# Patient Record
Sex: Male | Born: 1990 | Race: Black or African American | Hispanic: No | Marital: Married | Smoking: Former smoker
Health system: Southern US, Community
[De-identification: ages and names within clinical notes are randomized; demographics above are authoritative.]

## PROBLEM LIST (undated history)

## (undated) DIAGNOSIS — E119 Type 2 diabetes mellitus without complications: Secondary | ICD-10-CM

## (undated) DIAGNOSIS — I10 Essential (primary) hypertension: Secondary | ICD-10-CM

## (undated) DIAGNOSIS — Z8659 Personal history of other mental and behavioral disorders: Secondary | ICD-10-CM

## (undated) DIAGNOSIS — I340 Nonrheumatic mitral (valve) insufficiency: Secondary | ICD-10-CM

## (undated) DIAGNOSIS — F419 Anxiety disorder, unspecified: Secondary | ICD-10-CM

## (undated) DIAGNOSIS — J45909 Unspecified asthma, uncomplicated: Secondary | ICD-10-CM

## (undated) DIAGNOSIS — K5900 Constipation, unspecified: Secondary | ICD-10-CM

## (undated) DIAGNOSIS — F325 Major depressive disorder, single episode, in full remission: Secondary | ICD-10-CM

## (undated) HISTORY — DX: Unspecified asthma, uncomplicated: J45.909

## (undated) HISTORY — DX: Type 2 diabetes mellitus without complications: E11.9

## (undated) HISTORY — DX: Anxiety disorder, unspecified: F41.9

## (undated) HISTORY — DX: Major depressive disorder, single episode, in full remission: F32.5

## (undated) HISTORY — PX: NO PAST SURGERIES: SHX2092

## (undated) HISTORY — DX: Morbid (severe) obesity due to excess calories: E66.01

## (undated) HISTORY — DX: Constipation, unspecified: K59.00

## (undated) HISTORY — DX: Essential (primary) hypertension: I10

## (undated) HISTORY — DX: Personal history of other mental and behavioral disorders: Z86.59

## (undated) HISTORY — DX: Nonrheumatic mitral (valve) insufficiency: I34.0

---
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
## 2018-11-25 ENCOUNTER — Emergency Department (HOSPITAL_COMMUNITY)
Admission: EM | Admit: 2018-11-25 | Discharge: 2018-11-25 | Disposition: A | Discharge status: Home / Self Care | Payer: Self-pay | Attending: Emergency Medicine | Admitting: Emergency Medicine

## 2018-11-25 ENCOUNTER — Encounter (HOSPITAL_COMMUNITY): Payer: Self-pay

## 2018-11-25 ENCOUNTER — Other Ambulatory Visit: Payer: Self-pay

## 2018-11-25 DIAGNOSIS — K529 Noninfective gastroenteritis and colitis, unspecified: Secondary | ICD-10-CM | POA: Insufficient documentation

## 2018-11-25 LAB — URINALYSIS, ROUTINE W REFLEX MICROSCOPIC
Bacteria, UA: NONE SEEN
Bilirubin Urine: NEGATIVE
Glucose, UA: NEGATIVE mg/dL
KETONES UR: NEGATIVE mg/dL
LEUKOCYTES UA: NEGATIVE
Nitrite: NEGATIVE
PROTEIN: NEGATIVE mg/dL
Specific Gravity, Urine: 1.018 (ref 1.005–1.030)
pH: 5 (ref 5.0–8.0)

## 2018-11-25 LAB — COMPREHENSIVE METABOLIC PANEL
ALBUMIN: 4.4 g/dL (ref 3.5–5.0)
ALT: 28 U/L (ref 0–44)
AST: 26 U/L (ref 15–41)
Alkaline Phosphatase: 77 U/L (ref 38–126)
Anion gap: 8 (ref 5–15)
BUN: 10 mg/dL (ref 6–20)
CHLORIDE: 105 mmol/L (ref 98–111)
CO2: 23 mmol/L (ref 22–32)
Calcium: 9.5 mg/dL (ref 8.9–10.3)
Creatinine, Ser: 0.9 mg/dL (ref 0.61–1.24)
GFR calc Af Amer: >60 mL/min (ref >60)
GFR calc non Af Amer: >60 mL/min (ref >60)
Glucose, Bld: 151 mg/dL — ABNORMAL HIGH (ref 70–99)
POTASSIUM: 4.4 mmol/L (ref 3.5–5.1)
Sodium: 136 mmol/L (ref 135–145)
Total Bilirubin: 0.4 mg/dL (ref 0.3–1.2)
Total Protein: 7.8 g/dL (ref 6.5–8.1)

## 2018-11-25 LAB — CBC
HEMATOCRIT: 49.8 % (ref 39.0–52.0)
Hemoglobin: 15.2 g/dL (ref 13.0–17.0)
MCH: 27.3 pg (ref 26.0–34.0)
MCHC: 30.5 g/dL (ref 30.0–36.0)
MCV: 89.6 fL (ref 80.0–100.0)
Platelets: 330 10*3/uL (ref 150–400)
RBC: 5.56 MIL/uL (ref 4.22–5.81)
RDW: 13.8 % (ref 11.5–15.5)
WBC: 10.8 10*3/uL — ABNORMAL HIGH (ref 4.0–10.5)
nRBC: 0 % (ref 0.0–0.2)

## 2018-11-25 LAB — LIPASE, BLOOD: Lipase: 32 U/L (ref 11–51)

## 2018-11-25 MED ORDER — DICYCLOMINE HCL 10 MG/ML IM SOLN
20.0000 mg | Freq: Once | INTRAMUSCULAR | Status: AC
  Administered 2018-11-25: 20 mg via INTRAMUSCULAR
  Filled 2018-11-25: qty 2

## 2018-11-25 MED ORDER — FAMOTIDINE IN NACL 20-0.9 MG/50ML-% IV SOLN
20.0000 mg | Freq: Once | INTRAVENOUS | Status: AC
  Administered 2018-11-25: 20 mg via INTRAVENOUS
  Filled 2018-11-25: qty 50

## 2018-11-25 MED ORDER — ACETAMINOPHEN 325 MG PO TABS
650.0000 mg | ORAL_TABLET | Freq: Once | ORAL | Status: AC
  Administered 2018-11-25: 650 mg via ORAL
  Filled 2018-11-25: qty 2

## 2018-11-25 MED ORDER — ALUM & MAG HYDROXIDE-SIMETH 200-200-20 MG/5ML PO SUSP
30.0000 mL | Freq: Once | ORAL | Status: AC
  Administered 2018-11-25: 30 mL via ORAL
  Filled 2018-11-25: qty 30

## 2018-11-25 MED ORDER — ONDANSETRON HCL 4 MG PO TABS: 4.0000 mg | ORAL_TABLET | Freq: Three times a day (TID) | ORAL | 0 refills | Status: DC | PRN

## 2018-11-25 MED ORDER — ONDANSETRON HCL 4 MG/2ML IJ SOLN
4.0000 mg | Freq: Once | INTRAMUSCULAR | Status: AC
  Administered 2018-11-25: 4 mg via INTRAVENOUS
  Filled 2018-11-25: qty 2

## 2018-11-25 MED ORDER — SODIUM CHLORIDE 0.9 % IV BOLUS
500.0000 mL | Freq: Once | INTRAVENOUS | Status: AC
  Administered 2018-11-25: 500 mL via INTRAVENOUS

## 2018-11-25 NOTE — Discharge Instructions (Addendum)
Please make sure to stay well hydrated at home. Please take medications as prescribed at home.  Please follow up with your primary doctor in regards to your elevated blood pressure.  Please follow up with your primary doctor within the next 5-7 days. Please return to the ER sooner if you have any new or worsening symptoms, or if you have any of the following symptoms:  Abdominal pain that does not go away.  You have a fever.  You keep throwing up (vomiting).  The pain is felt only in portions of the abdomen. Pain in the right side could possibly be appendicitis. In an adult, pain in the left lower portion of the abdomen could be colitis or diverticulitis.  You pass bloody or black tarry stools.  There is bright red blood in the stool.  The constipation stays for more than 4 days.  There is belly (abdominal) or rectal pain.  You do not seem to be getting better.  You have any questions or concerns.

## 2018-11-25 NOTE — ED Provider Notes (Signed)
[facility]-EMERGENCY DEPT Provider Note   CSN: [id number] Arrival date & time: 11/25/18  4098     History   Chief Complaint Chief Complaint  Patient presents with  . Emesis    HPI Fred Reyes is a 27 y.o. male.  HPI   Pt is a 27 y/o male who presents to the ED today c/o nausea, vomiting (x1), diarrhea (3-4), and abd pain that began this morning. Abd pain is located to the epigastric area. Pain is intermittent. Sometimes pain improves after vomiting, however sometimes it is worse. Pain is worse when he burps. Pain is rated at 6/10.   No hematemesis, hematochezia, or melena. Denies fevers or urinary sxs.   Pt states he drank ETOH last night. States he drank 4 beers. Denies drug use. Denies heavy NSAID use.   History reviewed. No pertinent past medical history.  There are no active problems to display for this patient.   Home Medications    Prior to Admission medications   Medication Sig Start Date End Date Taking? Authorizing Provider  ondansetron (ZOFRAN) 4 MG tablet Take 1 tablet (4 mg total) by mouth every 8 (eight) hours as needed for nausea or vomiting. 11/25/18   Zannah Melucci S, PA-C    Family History History reviewed. No pertinent family history.  Social History Social History   Tobacco Use  . Smoking status: Not on file  Substance Use Topics  . Alcohol use: Not on file  . Drug use: Not on file     Allergies   Patient has no known allergies.   Review of Systems Review of Systems  Constitutional: Negative for chills and fever.  HENT: Negative for congestion and rhinorrhea.   Eyes: Negative for visual disturbance.  Respiratory: Negative for cough and shortness of breath.   Cardiovascular: Negative for chest pain.  Gastrointestinal: Positive for abdominal pain, diarrhea, nausea and vomiting. Negative for blood in stool and constipation.  Musculoskeletal: Negative for back pain.  Skin: Negative for rash.  Neurological:  Negative for headaches.    Physical Exam Updated Vital Signs BP (!) 148/103   Pulse 78   Temp (!) 97.2 F (36.2 C) (Oral)   Resp 18   SpO2 99%   Physical Exam  Constitutional: He appears well-developed and well-nourished.  No acute distress  HENT:  Head: Normocephalic and atraumatic.  Mouth/Throat: Oropharynx is clear and moist.  Eyes: Conjunctivae are normal.  Neck: Neck supple.  Cardiovascular: Normal rate, regular rhythm and normal heart sounds.  No murmur heard. Pulmonary/Chest: Effort normal and breath sounds normal. No stridor. No respiratory distress. He has no wheezes.  Abdominal: Soft. Bowel sounds are normal. He exhibits no distension. There is tenderness (mild, LUQ). There is no rebound and no guarding.  No CVA TTP  Musculoskeletal: Normal range of motion.  Neurological: He is alert.  Skin: Skin is warm and dry.  Psychiatric: He has a normal mood and affect.  Nursing note and vitals reviewed.    ED Treatments / Results  Labs (all labs ordered are listed, but only abnormal results are displayed) Labs Reviewed  COMPREHENSIVE METABOLIC PANEL - Abnormal; Notable for the following components:      Result Value   Glucose, Bld 151 (*)    All other components within normal limits  CBC - Abnormal; Notable for the following components:   WBC 10.8 (*)    All other components within normal limits  URINALYSIS, ROUTINE W REFLEX MICROSCOPIC - Abnormal; Notable for the following  components:   Hgb urine dipstick SMALL (*)    All other components within normal limits  LIPASE, BLOOD    EKG None  Radiology No results found.  Procedures Procedures (including critical care time)  Medications Ordered in ED Medications  alum & mag hydroxide-simeth (MAALOX/MYLANTA) 200-200-20 MG/5ML suspension 30 mL (30 mLs Oral Given 11/25/18 0711)  famotidine (PEPCID) IVPB 20 mg premix (0 mg Intravenous Stopped 11/25/18 0746)  dicyclomine (BENTYL) injection 20 mg (20 mg Intramuscular  Given 11/25/18 0712)  ondansetron (ZOFRAN) injection 4 mg (4 mg Intravenous Given 11/25/18 0711)  sodium chloride 0.9 % bolus 500 mL (0 mLs Intravenous Stopped 11/25/18 0843)  acetaminophen (TYLENOL) tablet 650 mg (650 mg Oral Given 11/25/18 0924)     Initial Impression / Assessment and Plan / ED Course  I have reviewed the triage vital signs and the nursing notes.  Pertinent labs & imaging results that were available during my care of the patient were reviewed by me and considered in my medical decision making (see chart for details).     Final Clinical Impressions(s) / ED Diagnoses   Final diagnoses:  Gastroenteritis   Patient presenting with 1 day history of nausea vomiting and diarrhea.  Also with some mild epigastric abdominal pain that comes and goes.  No episodes of hematemesis, hematochezia or melena.  He is afebrile without urinary symptoms.  He has some mild epigastric and left upper quadrant abdominal tenderness without peritoneal signs.  His labs reassuring with only mild leukocytosis. No anemia.  Electrolytes within normal limits.  Kidney and liver enzymes normal.  Lipase negative and UA without evidence of urinary tract infection.  Patient able to tolerate p.o. in the ED without difficulty.  Repeat abdominal examination is reassuring and continues to be nonsurgical abdomen.  Feel the patient's presentation is consistent with viral gastroenteritis.  Will treat symptomatically and encourage p.o. fluids at home.  Return precautions were discussed as well as follow-up with PCP.  Patient was understanding of plan and reasons return the ED.  All questions answered.  Patient's blood pressure elevated in the emergency department today. Patient denies headache, change in vision, numbness, weakness, chest pain, dyspnea, dizziness, or lightheadedness therefore doubt hypertensive emergency. Discussed elevated blood pressure with the patient and the need for primary care follow up with potential  need to initiate or change antihypertensive medications and or for further evaluation. Discussed return precaution signs/symptoms for hypertensive emergency as listed above with the patient. He confirmed understanding.     ED Discharge Orders         Ordered    ondansetron (ZOFRAN) 4 MG tablet  Every 8 hours PRN     11/25/18 1003           Lakima Dona S, PA-C 11/25/18 1005    Geoffery Lyonselo, Douglas, MD 11/25/18 1516

## 2018-11-25 NOTE — ED Triage Notes (Signed)
Pt reports 2-3 episodes of vomiting since waking up this morning. He endorses malaise that started yesterday. Reports mild abdominal pain and nausea. A&Ox4.

## 2018-11-25 NOTE — ED Notes (Signed)
Pt tolerating fluids at this time. Pt c/o headache. PA aware.

## 2019-01-27 ENCOUNTER — Other Ambulatory Visit: Payer: Self-pay

## 2019-01-27 ENCOUNTER — Ambulatory Visit (HOSPITAL_COMMUNITY)
Admission: EM | Admit: 2019-01-27 | Discharge: 2019-01-27 | Disposition: A | Discharge status: Home / Self Care | Payer: PRIVATE HEALTH INSURANCE | Attending: Family Medicine | Admitting: Family Medicine

## 2019-01-27 ENCOUNTER — Encounter (HOSPITAL_COMMUNITY): Payer: Self-pay | Admitting: Emergency Medicine

## 2019-01-27 DIAGNOSIS — K529 Noninfective gastroenteritis and colitis, unspecified: Secondary | ICD-10-CM

## 2019-01-27 MED ORDER — ONDANSETRON 4 MG PO TBDP: 4.0000 mg | ORAL_TABLET | Freq: Three times a day (TID) | ORAL | 0 refills | Status: DC | PRN

## 2019-01-27 NOTE — ED Triage Notes (Addendum)
Pt reports abdominal pain & vomiting 5-6 times today until 1000.  Pt states he ate a half a burger about 1200 and was able to keep that down. Pt reports some throat soreness after vomiting.

## 2019-01-27 NOTE — ED Provider Notes (Signed)
[facility]    CSN: [id number] Arrival date & time: 01/27/19  1419     History   Chief Complaint Chief Complaint  Patient presents with  . Emesis    HPI Fred Reyes is a 28 y.o. male.   Patient is a 28 year old male presents with vomiting and generalized abdominal discomfort that started his morning.  He reports he vomited approximately 5-6 times.  Last episode was around 10:00.  He did eat half of a cheeseburger at 12:00 and was able to keep that down.  He still has some generalized nausea and abdominal discomfort. he did have a few episodes of diarrhea this morning also.  His girlfriend was recently sick with similar symptoms. He denies any fevers, chills, myalgias.  He denies any recent traveling.  ROS per HPI      History reviewed. No pertinent past medical history.  There are no active problems to display for this patient.   History reviewed. No pertinent surgical history.     Home Medications    Prior to Admission medications   Medication Sig Start Date End Date Taking? Authorizing Provider  ondansetron (ZOFRAN ODT) 4 MG disintegrating tablet Take 1 tablet (4 mg total) by mouth every 8 (eight) hours as needed for nausea or vomiting. 01/27/19   Yossef Gilkison A, NP  ondansetron (ZOFRAN) 4 MG tablet Take 1 tablet (4 mg total) by mouth every 8 (eight) hours as needed for nausea or vomiting. 11/25/18   Couture, Cortni S, PA-C    Family History Family History  Adopted: Yes    Social History Social History   Tobacco Use  . Smoking status: Never Smoker  . Smokeless tobacco: Never Used  Substance Use Topics  . Alcohol use: Never    Frequency: Never  . Drug use: Never     Allergies   Patient has no known allergies.   Review of Systems Review of Systems   Physical Exam Triage Vital Signs ED Triage Vitals  Enc Vitals Group     BP 01/27/19 1552 (!) 144/88     Pulse Rate 01/27/19 1552 85     Resp --      Temp 01/27/19 1552 97.9 F  (36.6 C)     Temp Source 01/27/19 1552 Temporal     SpO2 01/27/19 1552 99 %     Weight --      Height --      Head Circumference --      Peak Flow --      Pain Score 01/27/19 1554 0     Pain Loc --      Pain Edu? --      Excl. in GC? --    No data found.  Updated Vital Signs BP (!) 144/88 (BP Location: Right Wrist)   Pulse 85   Temp 97.9 F (36.6 C) (Temporal)   SpO2 99%   Visual Acuity Right Eye Distance:   Left Eye Distance:   Bilateral Distance:    Right Eye Near:   Left Eye Near:    Bilateral Near:     Physical Exam Vitals signs and nursing note reviewed.  Constitutional:      General: He is not in acute distress.    Appearance: Normal appearance. He is well-developed. He is not ill-appearing, toxic-appearing or diaphoretic.  HENT:     Head: Normocephalic and atraumatic.     Nose: Nose normal.     Mouth/Throat:     Pharynx: Oropharynx is clear.  Eyes:     Conjunctiva/sclera: Conjunctivae normal.  Neck:     Musculoskeletal: Normal range of motion and neck supple.  Cardiovascular:     Heart sounds: No murmur.  Pulmonary:     Effort: Pulmonary effort is normal.  Abdominal:     General: There is no distension.     Palpations: Abdomen is soft. There is no mass.     Tenderness: There is no abdominal tenderness. There is no guarding or rebound.     Hernia: No hernia is present.  Musculoskeletal: Normal range of motion.  Skin:    General: Skin is warm and dry.  Neurological:     Mental Status: He is alert.  Psychiatric:        Mood and Affect: Mood normal.      UC Treatments / Results  Labs (all labs ordered are listed, but only abnormal results are displayed) Labs Reviewed - No data to display  EKG None  Radiology No results found.  Procedures Procedures (including critical care time)  Medications Ordered in UC Medications - No data to display  Initial Impression / Assessment and Plan / UC Course  I have reviewed the triage vital signs  and the nursing notes.  Pertinent labs & imaging results that were available during my care of the patient were reviewed by me and considered in my medical decision making (see chart for details).     Patient is a 28 year old male that comes in today with resolved nausea, vomiting, diarrhea He was able to hold down fluids and food Girlfriend  was recently sick with similar symptoms Most likely viral gastroenteritis Zofran as needed for nausea, vomiting Work note given as requested  Final Clinical Impressions(s) / UC Diagnoses   Final diagnoses:  Gastroenteritis     Discharge Instructions     I believe this is a stomach virus Zofran as needed for nausea, vomiting Be sure you are drinking fluids and replacing electrolytes with Gatorade. Advance diet as tolerated.  Work note given Follow up as needed for continued or worsening symptoms     ED Prescriptions    Medication Sig Dispense Auth. Provider   ondansetron (ZOFRAN ODT) 4 MG disintegrating tablet Take 1 tablet (4 mg total) by mouth every 8 (eight) hours as needed for nausea or vomiting. 20 tablet Dahlia ByesBast, Noemi Bellissimo A, NP     Controlled Substance Prescriptions [place] Controlled Substance Registry consulted? Not Applicable   Janace ArisBast, Bashir Marchetti A, NP 01/27/19 1730

## 2019-01-27 NOTE — Discharge Instructions (Addendum)
I believe this is a stomach virus Zofran as needed for nausea, vomiting Be sure you are drinking fluids and replacing electrolytes with Gatorade. Advance diet as tolerated.  Work note given Follow up as needed for continued or worsening symptoms

## 2019-01-30 ENCOUNTER — Ambulatory Visit: Payer: Self-pay | Admitting: Family Medicine

## 2019-02-14 ENCOUNTER — Telehealth: Payer: Self-pay

## 2019-02-14 ENCOUNTER — Other Ambulatory Visit: Payer: Self-pay

## 2019-02-14 ENCOUNTER — Encounter: Payer: Self-pay | Admitting: Family Medicine

## 2019-02-14 ENCOUNTER — Ambulatory Visit (INDEPENDENT_AMBULATORY_CARE_PROVIDER_SITE_OTHER): Payer: PRIVATE HEALTH INSURANCE | Admitting: Family Medicine

## 2019-02-14 VITALS — BP 132/78 | HR 96 | Temp 97.9°F | Ht 68.0 in | Wt 374.4 lb

## 2019-02-14 DIAGNOSIS — Z113 Encounter for screening for infections with a predominantly sexual mode of transmission: Secondary | ICD-10-CM

## 2019-02-14 DIAGNOSIS — R011 Cardiac murmur, unspecified: Secondary | ICD-10-CM | POA: Diagnosis not present

## 2019-02-14 DIAGNOSIS — G4730 Sleep apnea, unspecified: Secondary | ICD-10-CM

## 2019-02-14 NOTE — Telephone Encounter (Signed)
Hi! Just letting you know we have put in a sleep study order for Fred Reyes.  Thank you, Melvenia Beam

## 2019-02-14 NOTE — Progress Notes (Signed)
New Patient Office Visit  Subjective:  Patient ID: Fred Reyes, male    DOB: 1991/04/24  Age: 28 y.o. MRN: [id number]  CC:  Chief Complaint  Patient presents with  . New Patient (Initial Visit)    HPI Fred Reyes presents for a new patient encounter.  His medical, surgical and social history were reviewed.  Of note Fred Reyes is adopted and not aware of any medical family history.  He also shared that he had a significant psychiatric history which has included 1 psychiatric hospitalization for a total of 5 months following a suicide attempt.  He reports that he has previously followed at [facility] for his psychiatric care and he knows that he has been previously diagnosed with anxiety, depression potentially schizophrenia (he reports that this diagnosis may have changed over time).  He knows it at one time he was on Geodon and is not currently taking any medication.  Past Medical History:  Diagnosis Date  . Anxiety   . Asthma due to environmental allergies   . Depression, major, in remission (HCC)   . History of psychiatric hospitalization    3 months for suicide attempt    History reviewed. No pertinent surgical history.   Family History  Adopted: Yes    Social History   Socioeconomic History  . Marital status: Married    Spouse name: Fred Reyes  . Number of children: 1  . Years of education: Not on file  . Highest education level: Not on file  Occupational History  . Not on file  Social Needs  . Financial resource strain: Not on file  . Food insecurity:    Worry: Not on file    Inability: Not on file  . Transportation needs:    Medical: Not on file    Non-medical: Not on file  Tobacco Use  . Smoking status: Former Smoker    Packs/day: 0.50    Years: 2.00    Pack years: 1.00  . Smokeless tobacco: Never Used  Substance and Sexual Activity  . Alcohol use: Yes    Alcohol/week: 7.0 standard drinks    Types: 7 Cans of beer per week    Frequency:  Never  . Drug use: Not Currently    Types: Marijuana, Cocaine    Comment: no MJ or cocaine use in about 2 years. previously used cocaine about once/month  . Sexual activity: Yes  Lifestyle  . Physical activity:    Days per week: Not on file    Minutes per session: Not on file  . Stress: Not on file  Relationships  . Social connections:    Talks on phone: Not on file    Gets together: Not on file    Attends religious service: Not on file    Active member of club or organization: Not on file    Attends meetings of clubs or organizations: Not on file    Relationship status: Not on file  . Intimate partner violence:    Fear of current or ex partner: Not on file    Emotionally abused: Not on file    Physically abused: Not on file    Forced sexual activity: Not on file  Other Topics Concern  . Not on file  Social History Narrative  . Not on file    ROS Review of Systems  Constitutional: Positive for fatigue.  HENT: Negative for sinus pain and trouble swallowing.   Eyes: Negative for photophobia.  Respiratory: Positive for apnea and shortness  of breath.   Gastrointestinal: Negative for abdominal distention and abdominal pain.  Endocrine: Negative for cold intolerance.  Musculoskeletal: Negative for arthralgias.  Allergic/Immunologic: Positive for environmental allergies.  Neurological: Negative for dizziness and weakness.  Hematological: Negative for adenopathy.  Psychiatric/Behavioral: Negative for suicidal ideas.    Objective:   Today's Vitals: BP 132/78   Pulse 96   Temp 97.9 F (36.6 C) (Oral)   Ht 5\' 8"  (1.727 m)   Wt (!) 169.8 kg   SpO2 96%   BMI 56.93 kg/m   Physical Exam Constitutional:      General: He is not in acute distress.    Appearance: Normal appearance. He is obese.  HENT:     Nose: No rhinorrhea.     Mouth/Throat:     Pharynx: Oropharynx is clear.  Eyes:     Extraocular Movements: Extraocular movements intact.     Conjunctiva/sclera:  Conjunctivae normal.     Pupils: Pupils are equal, round, and reactive to light.  Neck:     Musculoskeletal: Normal range of motion.  Cardiovascular:     Rate and Rhythm: Normal rate and regular rhythm.     Heart sounds: Murmur (2/6 holosystolic murmur loudest at left sternal border) present.  Pulmonary:     Effort: Pulmonary effort is normal. No respiratory distress.     Breath sounds: Normal breath sounds. No stridor. No wheezing or rales.  Abdominal:     Palpations: Abdomen is soft.     Tenderness: There is no guarding.  Musculoskeletal:        General: Swelling (bilateral LE edema 2+) present.  Lymphadenopathy:     Cervical: No cervical adenopathy.  Skin:    General: Skin is warm and dry.  Neurological:     General: No focal deficit present.     Mental Status: He is alert and oriented to person, place, and time. Mental status is at baseline.  Psychiatric:        Mood and Affect: Mood normal.        Behavior: Behavior normal.        Thought Content: Thought content normal.        Judgment: Judgment normal.     Assessment & Plan:   Problem List Items Addressed This Visit    None    Visit Diagnoses    Routine screening for STI (sexually transmitted infection)    -  Primary   Relevant Orders   HIV antibody (with reflex)   RPR   Newly recognized heart murmur       Relevant Orders   ECHOCARDIOGRAM COMPLETE   Sleep apnea, unspecified type       Relevant Orders   Ambulatory referral to Sleep Studies    Release of medical information for [facility] records was obtained.   Follow-up: Return in about 2 months (around 04/15/2019).   Mirian Mo, MD

## 2019-02-14 NOTE — Patient Instructions (Signed)
It was good to meet you today.  We covered a lot of good information and I put the important points below.  Likely sleep apnea -We are setting you up to get a sleep study that will determine if you need a CPAP to sleep better.  New heart murmur -You will be getting a heart ultrasound (ECHO) to take a look at the blood flow of your heart  I will let you know if any of your blood work is concerning.  I think it would be a good idea to see you again in a month or two.

## 2019-02-15 LAB — HIV ANTIBODY (ROUTINE TESTING W REFLEX): HIV Screen 4th Generation wRfx: NONREACTIVE

## 2019-02-15 LAB — RPR: RPR Ser Ql: NONREACTIVE

## 2019-02-17 ENCOUNTER — Emergency Department (HOSPITAL_COMMUNITY): Payer: No Typology Code available for payment source

## 2019-02-17 ENCOUNTER — Encounter (HOSPITAL_COMMUNITY): Payer: Self-pay

## 2019-02-17 ENCOUNTER — Emergency Department (HOSPITAL_COMMUNITY)
Admission: EM | Admit: 2019-02-17 | Discharge: 2019-02-17 | Disposition: A | Discharge status: Home / Self Care | Payer: No Typology Code available for payment source | Attending: Emergency Medicine | Admitting: Emergency Medicine

## 2019-02-17 ENCOUNTER — Other Ambulatory Visit: Payer: Self-pay

## 2019-02-17 DIAGNOSIS — Y999 Unspecified external cause status: Secondary | ICD-10-CM | POA: Insufficient documentation

## 2019-02-17 DIAGNOSIS — Y92410 Unspecified street and highway as the place of occurrence of the external cause: Secondary | ICD-10-CM | POA: Insufficient documentation

## 2019-02-17 DIAGNOSIS — J45909 Unspecified asthma, uncomplicated: Secondary | ICD-10-CM | POA: Insufficient documentation

## 2019-02-17 DIAGNOSIS — Z87891 Personal history of nicotine dependence: Secondary | ICD-10-CM | POA: Insufficient documentation

## 2019-02-17 DIAGNOSIS — M25511 Pain in right shoulder: Secondary | ICD-10-CM | POA: Diagnosis present

## 2019-02-17 DIAGNOSIS — Y9389 Activity, other specified: Secondary | ICD-10-CM | POA: Diagnosis not present

## 2019-02-17 MED ORDER — METHOCARBAMOL 500 MG PO TABS: 500.0000 mg | ORAL_TABLET | Freq: Two times a day (BID) | ORAL | 0 refills | Status: DC

## 2019-02-17 MED ORDER — NAPROXEN 500 MG PO TABS: 500.0000 mg | ORAL_TABLET | Freq: Two times a day (BID) | ORAL | 0 refills | Status: DC
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
## 2019-02-19 ENCOUNTER — Ambulatory Visit (HOSPITAL_COMMUNITY): Payer: No Typology Code available for payment source | Attending: Cardiology

## 2019-02-19 DIAGNOSIS — R011 Cardiac murmur, unspecified: Secondary | ICD-10-CM | POA: Diagnosis present

## 2019-02-25 ENCOUNTER — Encounter: Payer: Self-pay | Admitting: Family Medicine

## 2019-04-02 ENCOUNTER — Encounter: Payer: Self-pay | Admitting: Family Medicine

## 2019-04-08 ENCOUNTER — Telehealth: Payer: Self-pay

## 2019-04-08 NOTE — Telephone Encounter (Signed)
Pt calls nurse line stating he still has not heard anything from his sleep study referral. This was placed back in Feb and routed to [employer]. Will forward to referral coordinator to FU.

## 2019-04-16 ENCOUNTER — Ambulatory Visit (HOSPITAL_COMMUNITY)
Admission: RE | Admit: 2019-04-16 | Discharge: 2019-04-16 | Disposition: A | Discharge status: Home / Self Care | Payer: PRIVATE HEALTH INSURANCE | Source: Ambulatory Visit | Attending: Family Medicine | Admitting: Family Medicine

## 2019-04-16 ENCOUNTER — Encounter: Payer: Self-pay | Admitting: Family Medicine

## 2019-04-16 ENCOUNTER — Ambulatory Visit: Payer: Self-pay | Admitting: Family Medicine

## 2019-04-16 ENCOUNTER — Other Ambulatory Visit: Payer: Self-pay

## 2019-04-16 ENCOUNTER — Ambulatory Visit (INDEPENDENT_AMBULATORY_CARE_PROVIDER_SITE_OTHER): Payer: Self-pay | Admitting: Family Medicine

## 2019-04-16 VITALS — BP 136/92 | HR 98

## 2019-04-16 DIAGNOSIS — Z8639 Personal history of other endocrine, nutritional and metabolic disease: Secondary | ICD-10-CM | POA: Insufficient documentation

## 2019-04-16 DIAGNOSIS — I340 Nonrheumatic mitral (valve) insufficiency: Secondary | ICD-10-CM | POA: Insufficient documentation

## 2019-04-16 DIAGNOSIS — E119 Type 2 diabetes mellitus without complications: Secondary | ICD-10-CM

## 2019-04-16 DIAGNOSIS — I10 Essential (primary) hypertension: Secondary | ICD-10-CM | POA: Insufficient documentation

## 2019-04-16 DIAGNOSIS — I3489 Other nonrheumatic mitral valve disorders: Secondary | ICD-10-CM

## 2019-04-16 DIAGNOSIS — R011 Cardiac murmur, unspecified: Secondary | ICD-10-CM

## 2019-04-16 DIAGNOSIS — I348 Other nonrheumatic mitral valve disorders: Secondary | ICD-10-CM

## 2019-04-16 HISTORY — DX: Other nonrheumatic mitral valve disorders: I34.89

## 2019-04-16 HISTORY — DX: Morbid (severe) obesity due to excess calories: E66.01

## 2019-04-16 HISTORY — DX: Type 2 diabetes mellitus without complications: E11.9

## 2019-04-16 HISTORY — DX: Other nonrheumatic mitral valve disorders: I34.8

## 2019-04-16 HISTORY — DX: Nonrheumatic mitral (valve) insufficiency: I34.0

## 2019-04-16 HISTORY — DX: Essential (primary) hypertension: I10

## 2019-04-16 LAB — POCT GLYCOSYLATED HEMOGLOBIN (HGB A1C): Hemoglobin A1C: 7.3 % — AB (ref 4.0–5.6)

## 2019-04-16 MED ORDER — VALSARTAN 80 MG PO TABS: 80.0000 mg | ORAL_TABLET | Freq: Every day | ORAL | 3 refills | Status: DC

## 2019-04-16 MED ORDER — METFORMIN HCL 500 MG PO TABS: 500.0000 mg | ORAL_TABLET | Freq: Two times a day (BID) | ORAL | 3 refills | Status: DC

## 2019-04-16 NOTE — Patient Instructions (Signed)
Several things. 1. You need to be on a high blood pressure medication.  I sent in a new prescription for you. 2. You are an early diabetic.  I sent in a medicine for you to start.  I also ordered diabetic education classes for you.  Someone should call. 3. Call us Friday and ask about the sleep study. 4. Because of changes I saw on the echocardiogram, I want you to see a cardiologist.  Someone should call. 5. I will call you to give you the results of your thyroid and cholesterol test.  Remember what I said.  These problems will not harm you anytime soon.  They are problems that can cause long-term damage if we don't take care of them.  Most of your problems stem from your weight.  If you want to be your healthiest in the long run, you need to lose weight.

## 2019-04-16 NOTE — Progress Notes (Signed)
Established Patient Office Visit  Subjective:  Patient ID: Fred Reyes, male    DOB: Nov 25, 1991  Age: 28 y.o. MRN: [id number]  CC:  Chief Complaint  Patient presents with  . Annual Exam    HPI Fred MascotLawaun Beitler presents for  Follow up of abnormal echocardiogram.  Not an annual exam.  Patient with known morbid obesity and past two BP readings high found to have heart m or exam of 02/14/19.  Echo done and has several important abnormalities that need FU.  Severe left ventricular wall thickening and degeneration of the mitral valve.  He denies CP, DOE, orthopnea and PND.    Also blood sugar elevated last exam.  That and morbid obesity puts him at risk for DM. Denies polyuria, polydipsia.  Works in [employer].  Diet and exercise could obviously improve.  POCT A1C is elevated and meets criteria for DM   Never had lipids.  Not fasting today.    Apparently has a sleep study ordered.  No one has called him to set it up.  Echo showed normal RV.  RV systolic pressures could not be assessed.        Past Medical History:  Diagnosis Date  . Anxiety   . Asthma due to environmental allergies   . Constipation 12/18/2008   ED visit  . Depression, major, in remission (HCC)   . History of psychiatric hospitalization    3 months for suicide attempt    History reviewed. No pertinent surgical history.  Family History  Adopted: Yes  Family history unknown: Yes    Social History   Socioeconomic History  . Marital status: Married    Spouse name: Selinda EonLatasha Dawkins  . Number of children: 1  . Years of education: Not on file  . Highest education level: Not on file  Occupational History  . Not on file  Social Needs  . Financial resource strain: Not on file  . Food insecurity:    Worry: Not on file    Inability: Not on file  . Transportation needs:    Medical: Not on file    Non-medical: Not on file  Tobacco Use  . Smoking status: Former Smoker    Packs/day: 0.50    Years: 2.00   Pack years: 1.00  . Smokeless tobacco: Never Used  Substance and Sexual Activity  . Alcohol use: Yes    Alcohol/week: 7.0 standard drinks    Types: 7 Cans of beer per week    Frequency: Never  . Drug use: Not Currently    Types: Marijuana, Cocaine    Comment: no MJ or cocaine use in about 2 years. previously used cocaine about once/month  . Sexual activity: Yes  Lifestyle  . Physical activity:    Days per week: Not on file    Minutes per session: Not on file  . Stress: Not on file  Relationships  . Social connections:    Talks on phone: Not on file    Gets together: Not on file    Attends religious service: Not on file    Active member of club or organization: Not on file    Attends meetings of clubs or organizations: Not on file    Relationship status: Not on file  . Intimate partner violence:    Fear of current or ex partner: Not on file    Emotionally abused: Not on file    Physically abused: Not on file    Forced sexual activity: Not on file  Other Topics Concern  . Not on file  Social History Narrative  . Not on file    Outpatient Medications Prior to Visit  Medication Sig Dispense Refill  . methocarbamol (ROBAXIN) 500 MG tablet Take 1 tablet (500 mg total) by mouth 2 (two) times daily. 20 tablet 0  . naproxen (NAPROSYN) 500 MG tablet Take 1 tablet (500 mg total) by mouth 2 (two) times daily. 30 tablet 0  . ondansetron (ZOFRAN ODT) 4 MG disintegrating tablet Take 1 tablet (4 mg total) by mouth every 8 (eight) hours as needed for nausea or vomiting. 20 tablet 0  . ondansetron (ZOFRAN) 4 MG tablet Take 1 tablet (4 mg total) by mouth every 8 (eight) hours as needed for nausea or vomiting. 5 tablet 0   No facility-administered medications prior to visit.     No Known Allergies  ROS Review of Systems    Objective:    Physical Exam  BP (!) 136/92   Pulse 98   SpO2 98%  Wt Readings from Last 3 Encounters:  02/17/19 (!) 374 lb 5.5 oz (169.8 kg)  02/14/19 (!)  374 lb 6.4 oz (169.8 kg)   No JVD (limited by thick neck.)  Lungs clear Cardiac RRR with 1-2/6 SEM (does not seem holosystolic to me) Abd benign Ext 1+ bilateral edema.  EKG - no acute changes.  No obvious LVH.  Health Maintenance Due  Topic Date Due  . TETANUS/TDAP  11/10/2010    There are no preventive care reminders to display for this patient.  No results found for: TSH Lab Results  Component Value Date   WBC 10.8 (H) 11/25/2018   HGB 15.2 11/25/2018   HCT 49.8 11/25/2018   MCV 89.6 11/25/2018   PLT 330 11/25/2018   Lab Results  Component Value Date   NA 136 11/25/2018   K 4.4 11/25/2018   CO2 23 11/25/2018   GLUCOSE 151 (H) 11/25/2018   BUN 10 11/25/2018   CREATININE 0.90 11/25/2018   BILITOT 0.4 11/25/2018   ALKPHOS 77 11/25/2018   AST 26 11/25/2018   ALT 28 11/25/2018   PROT 7.8 11/25/2018   ALBUMIN 4.4 11/25/2018   CALCIUM 9.5 11/25/2018   ANIONGAP 8 11/25/2018   No results found for: CHOL No results found for: HDL No results found for: LDLCALC No results found for: TRIG No results found for: CHOLHDL No results found for: KKXF8H    Assessment & Plan:   Problem List Items Addressed This Visit    Myxomatous degeneration of mitral valve   Morbid obesity (HCC)   Relevant Orders   TSH   Hypertension - Primary   Relevant Orders   Lipid Panel    Other Visit Diagnoses    Heart murmur       Relevant Orders   EKG 12-Lead   History of elevated glucose       Relevant Orders   POCT glycosylated hemoglobin (Hb A1C)      No orders of the defined types were placed in this encounter.   Follow-up: No follow-ups on file.    Moses Manners, MD

## 2019-04-17 LAB — TSH: TSH: 2.04 u[IU]/mL (ref 0.450–4.500)

## 2019-04-17 LAB — LIPID PANEL
Chol/HDL Ratio: 5.5 ratio — ABNORMAL HIGH (ref 0.0–5.0)
Cholesterol, Total: 193 mg/dL (ref 100–199)
HDL: 35 mg/dL — ABNORMAL LOW (ref >39)
LDL Calculated: 114 mg/dL — ABNORMAL HIGH (ref 0–99)
Triglycerides: 221 mg/dL — ABNORMAL HIGH (ref 0–149)
VLDL Cholesterol Cal: 44 mg/dL — ABNORMAL HIGH (ref 5–40)

## 2019-04-17 NOTE — Assessment & Plan Note (Signed)
I cannot explain the degenerative changes of mitral valve seen on echo.  Will consult cards.

## 2019-04-17 NOTE — Assessment & Plan Note (Signed)
This is at the root of his other problems.  Counseled on diet and exercise.  Even spoke with his wife on cell phone.  They seem nicely motivated.  FU one month with PCP

## 2019-04-17 NOTE — Assessment & Plan Note (Signed)
New diagnosis, start on metformin and arrange DM education classes.

## 2019-04-17 NOTE — Assessment & Plan Note (Signed)
LV thickening convinces me that it is time to label him as hypertensive and medicate.

## 2019-04-22 ENCOUNTER — Telehealth: Payer: Self-pay | Admitting: Cardiology

## 2019-04-22 NOTE — Telephone Encounter (Signed)
Unable to reach to set up [email or web address] 04/22/19//New pt/smartphone/

## 2019-04-22 NOTE — Telephone Encounter (Signed)
Follow up   Patient is returning your call. Please call the mobile phone # on file.

## 2019-04-23 ENCOUNTER — Telehealth (INDEPENDENT_AMBULATORY_CARE_PROVIDER_SITE_OTHER): Payer: PRIVATE HEALTH INSURANCE | Admitting: Cardiology

## 2019-04-23 ENCOUNTER — Other Ambulatory Visit: Payer: Self-pay

## 2019-04-23 VITALS — Ht 68.0 in

## 2019-04-23 DIAGNOSIS — G4719 Other hypersomnia: Secondary | ICD-10-CM | POA: Diagnosis not present

## 2019-04-23 DIAGNOSIS — I340 Nonrheumatic mitral (valve) insufficiency: Secondary | ICD-10-CM | POA: Diagnosis not present

## 2019-04-23 DIAGNOSIS — R0683 Snoring: Secondary | ICD-10-CM

## 2019-04-23 DIAGNOSIS — Z7189 Other specified counseling: Secondary | ICD-10-CM

## 2019-04-23 DIAGNOSIS — R931 Abnormal findings on diagnostic imaging of heart and coronary circulation: Secondary | ICD-10-CM

## 2019-04-23 DIAGNOSIS — E119 Type 2 diabetes mellitus without complications: Secondary | ICD-10-CM | POA: Diagnosis not present

## 2019-04-23 DIAGNOSIS — I10 Essential (primary) hypertension: Secondary | ICD-10-CM

## 2019-04-23 DIAGNOSIS — I3489 Other nonrheumatic mitral valve disorders: Secondary | ICD-10-CM

## 2019-04-23 DIAGNOSIS — I517 Cardiomegaly: Secondary | ICD-10-CM

## 2019-04-23 NOTE — Patient Instructions (Signed)
Medication Instructions:  Your physician recommends that you continue on your current medications as directed. Please refer to the Current Medication list given to you today.  If you need a refill on your cardiac medications before your next appointment, please call your pharmacy.   Lab work: BMET: Before your cardiac MRA  If you have labs (blood work) drawn today and your tests are completely normal, you will receive your results only by: Marland Kitchen MyChart Message (if you have MyChart) OR . A paper copy in the mail If you have any lab test that is abnormal or we need to change your treatment, we will call you to review the results.  Testing/Procedures: Your physician has requested that you have a cardiac MRI. Cardiac MRI uses a computer to create images of your heart as its beating, producing both still and moving pictures of your heart and major blood vessels. For further information please visit [email or web address]. Please follow the instruction sheet given to you today for more information.  Your physician has recommended that you have a Home sleep study. This test records several body functions during sleep, including: brain activity, eye movement, oxygen and carbon dioxide blood levels, heart rate and rhythm, breathing rate and rhythm, the flow of air through your mouth and nose, snoring, body muscle movements, and chest and belly movement.  Follow-Up: As needed, pending results.

## 2019-04-23 NOTE — Progress Notes (Signed)
Virtual Visit via Video Note   This visit type was conducted due to national recommendations for restrictions regarding the COVID-19 Pandemic (e.g. social distancing) in an effort to limit this patient's exposure and mitigate transmission in our community.  Due to his co-morbid illnesses, this patient is at least at moderate risk for complications without adequate follow up.  This format is felt to be most appropriate for this patient at this time.  All issues noted in this document were discussed and addressed.  A limited physical exam was performed with this format.  Please refer to the patient's chart for his consent to telehealth for [facility].   Evaluation Performed: Cardiology Consult  This visit type was conducted due to national recommendations for restrictions regarding the COVID-19 Pandemic (e.g. social distancing).  This format is felt to be most appropriate for this patient at this time.  All issues noted in this document were discussed and addressed.  No physical exam was performed (except for noted visual exam findings with Video Visits).  Please refer to the patient's chart (MyChart message for video visits and phone note for telephone visits) for the patient's consent to telehealth for [facility].  Date:  04/23/2019   ID:  Fred Reyes, DOB 06/05/1991, MRN [id number]  Patient Location:  Home  Provider location:   [place]  PCP:  [name], MD  Cardiologist:  NEW Electrophysiologist:  None   Chief Complaint:  Heart murmur and abnormal echo  History of Present Illness:    Fred Reyes is a 28 y.o. male who presents via audio/video conferencing for a telehealth visit today in referral by [name], MD for evaluation of mitral valve.  This is a 28yo male with a history of asthma, anxiety/depression, morbid obesity, HTN and type 2 DM who was noted to have a heart murmur on PE and 2D echo was done showing normal LVF with EF 60-65% and severe LVH with mild  thickening of the MV with mild calcification and moderate MAC.  No MR noted.   He denies any chest pain or pressure, SOB, DOE, PND, orthopnea, LE edema, dizziness, palpitations or syncope. He is compliant with his meds and is tolerating meds with no SE. He is adopted so he does not know his parents medical hx.  He does not smoke and only occasionally drinks ETOH.  He also tells me that he has been having problems with sleep.  His sleep is very erratic at night and then he is very hard more day mostly from the day.  It is all that he needs a sleep study but that was never set up.  His wife says that he snores loudly but has not witnessed him stopping breathing.  The patient does not have symptoms concerning for COVID-19 infection (fever, chills, cough, or new shortness of breath).    Prior CV studies:   The following studies were reviewed today:  2D echo  Past Medical History:  Diagnosis Date  . Anxiety   . Asthma due to environmental allergies   . Constipation 12/18/2008   ED visit  . Depression, major, in remission ([place])   . History of psychiatric hospitalization    3 months for suicide attempt  . Hypertension 04/16/2019  . Morbid obesity ([place]) 04/16/2019  . 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 Type 2 diabetes mellitus without complication, without long-term current use of insulin ([place]) 04/16/2019   Past Surgical History:  Procedure Laterality Date  . NO PAST SURGERIES  Current Meds  Medication Sig  . metFORMIN (GLUCOPHAGE) 500 MG tablet Take 1 tablet (500 mg total) by mouth 2 (two) times Fred with a meal.  . naproxen (NAPROSYN) 500 MG tablet Take 1 tablet (500 mg total) by mouth 2 (two) times Fred.  . ondansetron (ZOFRAN ODT) 4 MG disintegrating tablet Take 1 tablet (4 mg total) by mouth every 8 (eight) hours as needed for nausea or vomiting.  . ondansetron (ZOFRAN) 4 MG tablet Take 1 tablet (4 mg total) by mouth every 8 (eight) hours as needed for nausea or  vomiting.  . valsartan (DIOVAN) 80 MG tablet Take 1 tablet (80 mg total) by mouth at bedtime.     Allergies:   Patient has no known allergies.   Social History   Tobacco Use  . Smoking status: Former Smoker    Packs/day: 0.50    Years: 2.00    Pack years: 1.00  . Smokeless tobacco: Never Used  Substance Use Topics  . Alcohol use: Yes    Alcohol/week: 7.0 standard drinks    Types: 7 Cans of beer per week    Frequency: Never  . Drug use: Not Currently    Types: Marijuana, Cocaine    Comment: no MJ or cocaine use in about 2 years. previously used cocaine about once/month     Family Hx: The patient's He was adopted. Family history is unknown by patient.  ROS:   Please see the history of present illness.    All other systems reviewed and are negative.   Labs/Other Tests and Data Reviewed:    Recent Labs: 11/25/2018: ALT 28; BUN 10; Creatinine, Ser 0.90; Hemoglobin 15.2; Platelets 330; Potassium 4.4; Sodium 136 04/16/2019: TSH 2.040   Recent Lipid Panel Lab Results  Component Value Date/Time   CHOL 193 04/16/2019 04:44 PM   TRIG 221 (H) 04/16/2019 04:44 PM   HDL 35 (L) 04/16/2019 04:44 PM   CHOLHDL 5.5 (H) 04/16/2019 04:44 PM   LDLCALC 114 (H) 04/16/2019 04:44 PM    Wt Readings from Last 3 Encounters:  02/17/19 (!) 374 lb 5.5 oz (169.8 kg)  02/14/19 (!) 374 lb 6.4 oz (169.8 kg)     Objective:    Vital Signs:  Ht _0  (1.727 m)   BMI 56.92 kg/m    CONSTITUTIONAL:  Well nourished, well developed male in no acute distress.  EYES: anicteric MOUTH: oral mucosa is pink RESPIRATORY: Normal respiratory effort, symmetric expansion CARDIOVASCULAR: No peripheral edema SKIN: No rash, lesions or ulcers MUSCULOSKELETAL: no digital cyanosis NEURO: Cranial Nerves II-XII grossly intact, moves all extremities PSYCH: Intact judgement and insight.  A&O x 3, Mood/affect appropriate   ASSESSMENT & PLAN:    1.  Mitral valve disease - he has mildly thickened and calcified MV  leaflets with MAC but no mitral valvular insufficiency.  At this time no further workup necessary.  Will followup with serial echo in 2 years.   2.  Severe LVH - this was noted on 2D echo.  He does have a hx of HTN.Marland Kitchen  He has no fm hx of HOCM that he is aware of and no family hx of SCD.  I have recommended a cardiac MRI to assess for HOCM given his young age and marked LVH.   He will continue on Valsartan 60m Fred.    3.  Type 2 DM - this is followed by his PCP.   He is on metformin 504mBID.  4.  Morbid obesity - I have encouraged  him to get into a routine exercise program and cut back on carbs and portions.   5.  Excessive daytime sleepiness -given his morbid obesity and severe snoring as well as poor sleep at night and bad sleepiness during the day I suspect that he has significant sleep apnea.  I recommended getting a home sleep study to evaluate for this further.  6.  COVID-19 Education:The signs and symptoms of COVID-19 were discussed with the patient and how to seek care for testing (follow up with PCP or arrange E-visit).  The importance of social distancing was discussed today.  Patient Risk:   After full review of this patient's clinical status, I feel that they are at least moderate risk at this time.  Time:   Today, I have spent 25 minutes directly with the patient on video discussing medical problems including MV dx, reviewing 2D echo, DM, snoring, excessive daytime sleepiness and obesity.  We also reviewed the symptoms of COVID 19 and the ways to protect against contracting the virus with telehealth technology.  I spent an additional 5 minutes reviewing patient's chart including 2D echo and prior OV notes from PCP.  Medication Adjustments/Labs and Tests Ordered: Current medicines are reviewed at length with the patient today.  Concerns regarding medicines are outlined above.  Tests Ordered: No orders of the defined types were placed in this encounter.  Medication Changes: No  orders of the defined types were placed in this encounter.   Disposition:  Follow up PRN pending results of studies  Signed, Fransico Him, MD  04/23/2019 1:57 PM    [facility]

## 2019-04-24 ENCOUNTER — Telehealth: Payer: PRIVATE HEALTH INSURANCE | Admitting: Cardiology

## 2019-05-05 ENCOUNTER — Telehealth: Payer: Self-pay | Admitting: *Deleted

## 2019-05-05 ENCOUNTER — Encounter: Payer: Self-pay | Admitting: Family Medicine

## 2019-05-05 NOTE — Telephone Encounter (Signed)
-----   Message from Ben G Kordsmeier, RN sent at 04/23/2019  3:53 PM EDT ----- Dr. Turner ordered a home sleep study. ESS: 17. Please schedule MRA, BMET ordered.   Thanks, Ben ----- Message ----- From: Turner, Traci R, MD Sent: 04/23/2019   1:58 PM EDT To: Ben G Kordsmeier, RN  Cardiac MRI/MRA for severe LVH on echo - rule out HOCM.  2D echo in 2 year for MV disease. PRN followup pending results of MRI   

## 2019-05-05 NOTE — Telephone Encounter (Signed)
Patient is aware and agreeable to Home Sleep Study through [facility]. Patient is scheduled for 05/28/19 at 11:30 to pick up home sleep kit and meet with Respiratory therapist at [facility]. Patient is aware that if this appointment date and time does not work for them they should contact Artis Delay directly at [phone]. Patient is aware that a sleep packet will be sent from [facility] in week. Patient is agreeable to treatment and thankful for call

## 2019-05-13 ENCOUNTER — Encounter: Payer: PRIVATE HEALTH INSURANCE | Attending: Family Medicine | Admitting: *Deleted

## 2019-05-28 ENCOUNTER — Ambulatory Visit (HOSPITAL_BASED_OUTPATIENT_CLINIC_OR_DEPARTMENT_OTHER): Payer: No Typology Code available for payment source

## 2019-05-28 ENCOUNTER — Telehealth: Payer: Self-pay | Admitting: *Deleted

## 2019-05-28 NOTE — Telephone Encounter (Signed)
S/W rep to see if PA is required for sleep study. He states that it is not. They are just a "supplemental insurance " and usually pay a set rate.

## 2019-05-28 NOTE — Telephone Encounter (Signed)
-----   Message from Sarina Ill, RN sent at 04/23/2019  3:53 PM EDT ----- Dr. Radford Pax ordered a home sleep study. ESS: 17. Please schedule MRA, BMET ordered.   Thanks, Suezanne Jacquet ----- Message ----- From: Sueanne Margarita, MD Sent: 04/23/2019   1:58 PM EDT To: Sarina Ill, RN  Cardiac MRI/MRA for severe LVH on echo - rule out HOCM.  2D echo in 2 year for MV disease. PRN followup pending results of MRI

## 2019-05-29 ENCOUNTER — Encounter: Payer: Self-pay | Admitting: Family Medicine

## 2019-06-02 ENCOUNTER — Ambulatory Visit: Payer: PRIVATE HEALTH INSURANCE

## 2019-06-02 NOTE — Progress Notes (Deleted)
  Subjective:   Patient ID: Fred Reyes    DOB: 06-18-1991, 28 y.o. male   MRN: [id number]  Fred Reyes is a 28 y.o. male with a history of HTN, T2DM, morbid obesity, myxomatous degeneration of mitral valve here for   Heart check - seen previously 04/16/19 for abnormal ECHO (LVEF 60-65% with severe LVH and mild thickening of the MV with mild calcification and moderate MAC).  - follows with Cardiology, planning to f/u 2D echo in 2 years - planning for sleep study 6/10 and cardiac MRI - Meds: valsartan 52m - denies CP, DOE, orthopnea, PND, leg swelling, palpitations, syncope*** - ***  Review of Systems:  Per HPI.  PParker medications and smoking status reviewed.  Objective:   There were no vitals taken for this visit. Vitals and nursing note reviewed.  General: well nourished, well developed, in no acute distress with non-toxic appearance HEENT: normocephalic, atraumatic, moist mucous membranes Neck: supple, non-tender without lymphadenopathy CV: regular rate and rhythm without murmurs, rubs, or gallops, no lower extremity edema Lungs: clear to auscultation bilaterally with normal work of breathing Abdomen: soft, non-tender, non-distended, no masses or organomegaly palpable, normoactive bowel sounds Skin: warm, dry, no rashes or lesions Extremities: warm and well perfused, normal tone MSK: ROM grossly intact, strength intact, gait normal Neuro: Alert and oriented, speech normal  Assessment & Plan:   No problem-specific Assessment & Plan notes found for this encounter.  No orders of the defined types were placed in this encounter.  No orders of the defined types were placed in this encounter.   ARory Percy DO PGY-2, [facility] 06/02/2019 8:42 AM

## 2019-06-12 ENCOUNTER — Other Ambulatory Visit: Payer: Self-pay

## 2019-06-12 ENCOUNTER — Encounter: Payer: Self-pay | Admitting: Family Medicine

## 2019-06-12 ENCOUNTER — Ambulatory Visit (INDEPENDENT_AMBULATORY_CARE_PROVIDER_SITE_OTHER): Payer: PRIVATE HEALTH INSURANCE | Admitting: Family Medicine

## 2019-06-12 VITALS — BP 132/64 | Ht 68.0 in | Wt 345.0 lb

## 2019-06-12 DIAGNOSIS — E119 Type 2 diabetes mellitus without complications: Secondary | ICD-10-CM | POA: Diagnosis not present

## 2019-06-12 DIAGNOSIS — I10 Essential (primary) hypertension: Secondary | ICD-10-CM | POA: Diagnosis not present

## 2019-06-12 DIAGNOSIS — I517 Cardiomegaly: Secondary | ICD-10-CM | POA: Diagnosis not present

## 2019-06-12 DIAGNOSIS — E1169 Type 2 diabetes mellitus with other specified complication: Secondary | ICD-10-CM | POA: Insufficient documentation

## 2019-06-12 DIAGNOSIS — E785 Hyperlipidemia, unspecified: Secondary | ICD-10-CM

## 2019-06-12 MED ORDER — ATORVASTATIN CALCIUM 40 MG PO TABS: 40.0000 mg | ORAL_TABLET | Freq: Every day | ORAL | 3 refills | Status: DC

## 2019-06-12 NOTE — Assessment & Plan Note (Signed)
Patient with recent diagnosis of diabetes type 2 for A1c 7.3.  Patient was started on metformin 500 mg twice daily and seems to be tolerating it well.  Given young age obesity and cardiac history will increase to 1000 mg twice daily.  Patient also be started on statin given findings on lipid panel.  He will work on therapeutic lifestyle changes and follow-up with PCP in a few weeks.

## 2019-06-12 NOTE — Patient Instructions (Addendum)
It was great seeing you today! We have addressed the following issues today  1. I want you to increase your metformin to 1000 mg two times a day. Two pills in the morning and two pills in the evening. 2. I am starting you on a cholesterol medication. Take once a day. 3. I have scheduled the cardiac MRI as discussed. Follow up with Cardiology Dr. Radford Pax.   If we did any lab work today, and the results require attention, either me or my nurse will get in touch with you. If everything is normal, you will get a letter in mail and a message via . If you don't hear from [place] in two weeks, please give [place] a call. Otherwise, we look forward to seeing you again at your next visit. If you have any questions or concerns before then, please call the clinic at [phone].  Please bring all your medications to every doctors visit  Sign up for My Chart to have easy access to your labs results, and communication with your Primary care physician. Please ask Front Desk for some assistance.   Please check-out at the front desk before leaving the clinic.    Take Care,   Dr. Andy Gauss

## 2019-06-12 NOTE — Assessment & Plan Note (Signed)
Patient with recent diagnosis of diabetes.  Lipid panel showed cholesterol 193, LDL 114, HDL 35, triglyceride 221.  Patient will be started on atorvastatin 40 mg daily.  She will work on diet and exercise as well as taking metformin.  We will follow-up in a few weeks.

## 2019-06-12 NOTE — Progress Notes (Signed)
Subjective:    Patient ID: Fred Reyes, male    DOB: Dec 10, 1991, 28 y.o.   MRN: [id number]   CC: Follow-up on large heart  HPI: Patient is a 28 year old male with recent diagnosis of type 2 diabetes, hypertension as well as recent diagnosis LVH on echo who presents today to follow-up on heart issues.  Patient reports that he was told by the month ago he had a large heart.  This consultation have been through the phone with cardiology.  Patient was supposed to have a cardiac MRI which he canceled.  Patient reports that he has been taking his metformin 500 mg 2 times a day as prescribed.  He does report some occasional lower extremity swelling.  He denies any shortness of breath or chest pain.  Patient also missed his sleep study but has been rescheduled for early July. Patient also was unable to go to diabetic education class due to work.  He has no acute complaint today.  He denies any fevers, chills, abdominal pain, headache, dizziness.  Smoking status reviewed   ROS: all other systems were reviewed and are negative other than in the HPI   Past Medical History:  Diagnosis Date  . Anxiety   . Asthma due to environmental allergies   . Constipation 12/18/2008   ED visit  . Depression, major, in remission ([facility])   . History of psychiatric hospitalization    3 months for suicide attempt  . Hypertension 04/16/2019  . Morbid obesity ([facility]) 04/16/2019  . 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 Type 2 diabetes mellitus without complication, without long-term current use of insulin ([facility]) 04/16/2019    Past Surgical History:  Procedure Laterality Date  . NO PAST SURGERIES      Past medical history, surgical, family, and social history reviewed and updated in the EMR as appropriate.  Objective:  BP 132/64   Ht 5\' 8"  (1.727 m)   Wt (!) 345 lb (156.5 kg)   BMI 52.46 kg/m   Vitals and nursing note reviewed  General: NAD, pleasant, able to participate in exam Cardiac:  RRR, 2/6 systolic murmur, no murmurs. 2+ radial and PT pulses bilaterally Respiratory: CTAB, normal effort, No wheezes, rales or rhonchi Abdomen: soft, nontender, nondistended, no hepatic or splenomegaly, +BS Extremities: Trace edema Skin: warm and dry, no rashes noted Neuro: alert and oriented x4, no focal deficits Psych: Normal affect and mood   Assessment & Plan:   LVH (left ventricular hypertrophy) Patient presents today with concern for recent diagnosis of LVH on echo.  Patient recently talked to cardiology who recommended cardiac MRI.  However canceled as patient for unclear reason.  Patient is currently symptomatic except for intermittent lower extremity edema which she did not report to cardiology back in early May.  Patient is currently not on any diuretics.  On exam today he does not have any lower extremity edema.  Discussed with patient need to perform cardiac MRI as requested by cardiologist to further determine possibility for hypertrophic cardiomyopathy.  Patient has no knowledge of his family history since he was adopted.  MRI has been scheduled for July 8.  Patient will follow-up with cardiology to discuss results and therapy going forward.  If worsening swelling continue to be bothersome he will contact PCP for further management.  Type 2 diabetes mellitus without complication, without long-term current use of insulin ([facility]) Patient with recent diagnosis of diabetes type 2 for A1c 7.3.  Patient was started on metformin 500 mg twice  daily and seems to be tolerating it well.  Given young age obesity and cardiac history will increase to 1000 mg twice daily.  Patient also be started on statin given findings on lipid panel.  He will work on therapeutic lifestyle changes and follow-up with PCP in a few weeks.  Hypertension BP 130/64.  Patient close to goal.  Continue valsartan 80 mg daily.  Could consider adding HCTZ given worsening swelling.started on Lasix by cardiology in the future.   Hyperlipidemia associated with type 2 diabetes mellitus [facility]) Patient with recent diagnosis of diabetes.  Lipid panel showed cholesterol 193, LDL 114, HDL 35, triglyceride 221.  Patient will be started on atorvastatin 40 mg daily.  She will work on diet and exercise as well as taking metformin.  We will follow-up in a few weeks.    Marjie Skiff, MD [place] PGY-3

## 2019-06-12 NOTE — Assessment & Plan Note (Signed)
BP 130/64.  Patient close to goal.  Continue valsartan 80 mg daily.  Could consider adding HCTZ given worsening swelling.started on Lasix by cardiology in the future.

## 2019-06-12 NOTE — Assessment & Plan Note (Signed)
Patient presents today with concern for recent diagnosis of LVH on echo.  Patient recently talked to cardiology who recommended cardiac MRI.  However canceled as patient for unclear reason.  Patient is currently symptomatic except for intermittent lower extremity edema which she did not report to cardiology back in early May.  Patient is currently not on any diuretics.  On exam today he does not have any lower extremity edema.  Discussed with patient need to perform cardiac MRI as requested by cardiologist to further determine possibility for hypertrophic cardiomyopathy.  Patient has no knowledge of his family history since he was adopted.  MRI has been scheduled for July 8.  Patient will follow-up with cardiology to discuss results and therapy going forward.  If worsening swelling continue to be bothersome he will contact PCP for further management.

## 2019-06-25 ENCOUNTER — Ambulatory Visit (HOSPITAL_COMMUNITY): Admission: RE | Admit: 2019-06-25 | Payer: PRIVATE HEALTH INSURANCE | Source: Ambulatory Visit

## 2019-06-30 ENCOUNTER — Ambulatory Visit (HOSPITAL_BASED_OUTPATIENT_CLINIC_OR_DEPARTMENT_OTHER): Payer: No Typology Code available for payment source | Attending: Cardiology

## 2019-07-08 ENCOUNTER — Encounter: Payer: Self-pay | Admitting: Family Medicine

## 2019-07-08 ENCOUNTER — Ambulatory Visit (INDEPENDENT_AMBULATORY_CARE_PROVIDER_SITE_OTHER): Payer: PRIVATE HEALTH INSURANCE | Admitting: Family Medicine

## 2019-07-08 ENCOUNTER — Other Ambulatory Visit: Payer: Self-pay

## 2019-07-08 VITALS — BP 130/70 | HR 93 | Ht 68.0 in | Wt 346.5 lb

## 2019-07-08 DIAGNOSIS — M7989 Other specified soft tissue disorders: Secondary | ICD-10-CM

## 2019-07-08 DIAGNOSIS — M79672 Pain in left foot: Secondary | ICD-10-CM

## 2019-07-08 DIAGNOSIS — M79671 Pain in right foot: Secondary | ICD-10-CM

## 2019-07-08 NOTE — Progress Notes (Signed)
Received after-hours page from [facility] stating d-dimer obtained during office visit today with Dr. Pilar Plate due to foot swelling resulted normal, 0.28.  No further management at this time, will route to PCP to let patient know in the morning.   Patriciaann Clan, DO

## 2019-07-08 NOTE — Patient Instructions (Addendum)
I think that you foot pain is most likely musculoskeletal, meaning that it is a muscle or bone problem.  I would like to get a couple tests today to rule out blood clots and gout.  I have also put in an order for a foot xray which you can get at [facility].   The address for [facility] is: [address]. You can show up between 8 am and 5 pm to have the xray done.  I recommend taking Motrin for the pain right now.

## 2019-07-09 ENCOUNTER — Encounter: Payer: Self-pay | Admitting: Family Medicine

## 2019-07-09 DIAGNOSIS — M79672 Pain in left foot: Secondary | ICD-10-CM | POA: Insufficient documentation

## 2019-07-09 DIAGNOSIS — M79671 Pain in right foot: Secondary | ICD-10-CM | POA: Insufficient documentation

## 2019-07-09 DIAGNOSIS — M2140 Flat foot [pes planus] (acquired), unspecified foot: Secondary | ICD-10-CM | POA: Insufficient documentation

## 2019-07-09 LAB — D-DIMER, QUANTITATIVE: D-DIMER: 0.28 mg/L FEU (ref 0.00–0.49)

## 2019-07-09 LAB — URIC ACID: Uric Acid: 7.1 mg/dL (ref 3.7–8.6)

## 2019-07-09 NOTE — Assessment & Plan Note (Signed)
Undifferentiated right foot pain.  MSK etiology seems most likely at this time.  Low suspicion for distal DVT (will get d-dimer to rule out), low suspicion for gout (we will follow-up uric acid to rule out).  Low suspicion for infection due to lack of ulceration, erythema versus cellulitis-like appearance.  He denies current IV drug use and there is no evidence of injection in feet or between toes.  Possible tendinopathy, stress fracture, overt fracture.  Lack of trauma makes large fractures less likely.  High BMI puts him at high risk for repetitive stress injury. -Continue Motrin for pain -Follow-up d-dimer, uric acid -Follow-up right foot x-ray, 2 view -Encourage the use of stiff soled shoes -Work note given -Follow-up in 1 week

## 2019-07-09 NOTE — Progress Notes (Signed)
Subjective:  Fred Reyes is a 28 y.o. male who presents to the [facility] today with a chief complaint of right foot pain and swelling.   HPI: Right foot pain [name] reports significant right foot pain for the past 2 weeks.  His pain is described as occasional dull and occasional sharp pain emanating from the top of his right foot.  His pain seems to be exacerbated by exercise and standing and is alleviated with rest and Motrin.  He does not recall any significant trauma to his foot.  He reports waking up about 2 weeks ago find that his foot was significantly painful throughout the day.  It seems to only have worsened in the past 2 weeks.  He bought an over-the-counter ankle brace for additional support and has attempted taking Motrin for the past 2 days with mild relief.  He notes significant swelling on the top of his right foot in addition to his right ankle.  He has no complaints of calf or thigh swelling nor does he have complaints of calf or thigh tenderness.  He specifically denies shortness of breath, chest pain, palpitations.    Chief Complaint noted Review of Symptoms - see HPI PMH -morbid obesity (BMI 52)  Objective:  Physical Exam: BP 130/70   Pulse 93   Ht 5\' 8"  (1.727 m)   Wt (!) 346 lb 8 oz (157.2 kg)   SpO2 96%   BMI 52.69 kg/m    Gen: Sitting comfortably in the chair in the exam room.  Clear difficulty and pain with ambulation.  Antalgic gait favoring the left leg. CV: RRR, 2/6 systolic murmur Pulm: NWOB, CTAB with no crackles, wheezes, or rhonchi.  Breathing comfortably on room air GI: Normal bowel sounds present. Soft, Nontender, Nondistended. MSK: Thighs and calves without edema or tenderness to palpation bilaterally.  Left calf 50 cm circumference, right calf 50.5 cm circumference.  Right foot with moderate ankle and dorsum swelling.  Mild tenderness to palpation over dorsum of foot significant pain with toe flexion.  No tenderness with compression of the  metatarsals.  No significant pain with plantar flexion, dorsiflexion, inversion, eversion.  Decreased plantar arch bilaterally Skin: No evidence of skin breakdown, ulceration.  Results for orders placed or performed in visit on 07/08/19 (from the past 72 hour(s))  Uric Acid     Status: None   Collection Time: 07/08/19  5:10 PM  Result Value Ref Range   Uric Acid 7.1 3.7 - 8.6 mg/dL    Comment:            Therapeutic target for gout patients: <6.0  D-dimer, quantitative (not at [facility])     Status: None   Collection Time: 07/08/19  5:13 PM  Result Value Ref Range   D-DIMER 0.28 0.00 - 0.49 mg/L FEU    Comment: According to the assay manufacturer's published package insert, a normal (<0.50 mg/L FEU) D-dimer result in conjunction with a non-high clinical probability assessment, excludes deep vein thrombosis (DVT) and pulmonary embolism (PE) with high sensitivity. D-dimer values increase with age and this can make VTE exclusion of an older population difficult. To address this, the [place], based on best available evidence and recent guidelines, recommends that clinicians use age-adjusted D-dimer thresholds in patients greater than 19 years of age with: a) a low probability of PE who do not meet all Pulmonary Embolism Rule Out Criteria, or b) in those with intermediate probability of PE. The formula for an age-adjusted  D-dimer cut-off is "age/100". For example, a 28 year old patient would have an age-adjusted cut-off of 0.60 mg/L FEU and an 28 year old 0.80 mg/L FEU.      Assessment/Plan:  Right foot pain Undifferentiated right foot pain.  MSK etiology seems most likely at this time.  Low suspicion for distal DVT (will get d-dimer to rule out), low suspicion for gout (we will follow-up uric acid to rule out).  Low suspicion for infection due to lack of ulceration, erythema versus cellulitis-like appearance.  He denies current IV drug use and there is no evidence of  injection in feet or between toes.  Possible tendinopathy, stress fracture, overt fracture.  Lack of trauma makes large fractures less likely.  High BMI puts him at high risk for repetitive stress injury. -Continue Motrin for pain -Follow-up d-dimer, uric acid -Follow-up right foot x-ray, 2 view -Encourage the use of stiff soled shoes -Work note given -Follow-up in 1 week

## 2019-07-09 NOTE — Assessment & Plan Note (Signed)
>>  ASSESSMENT AND PLAN FOR BILATERAL FOOT PAIN WRITTEN ON 07/09/2019  8:49 AM BY DEMPSEY COY, MD  Undifferentiated right foot pain.  MSK etiology seems most likely at this time.  Low suspicion for distal DVT (will get d-dimer to rule out), low suspicion for gout (we will follow-up uric acid to rule out).  Low suspicion for infection due to lack of ulceration, erythema versus cellulitis-like appearance.  He denies current IV drug use and there is no evidence of injection in feet or between toes.  Possible tendinopathy, stress fracture, overt fracture.  Lack of trauma makes large fractures less likely.  High BMI puts him at high risk for repetitive stress injury. -Continue Motrin for pain -Follow-up d-dimer, uric acid -Follow-up right foot x-ray, 2 view -Encourage the use of stiff soled shoes -Work note given -Follow-up in 1 week

## 2019-07-15 ENCOUNTER — Telehealth: Payer: Self-pay | Admitting: *Deleted

## 2019-07-15 NOTE — Telephone Encounter (Signed)
Pt calling because he never received the results of his lab work.  Will forward to MD. Christen Bame, CMA

## 2019-07-16 ENCOUNTER — Ambulatory Visit: Payer: PRIVATE HEALTH INSURANCE | Admitting: Family Medicine

## 2019-07-16 NOTE — Telephone Encounter (Signed)
Patient called and informed that his d-dimer and uric acid were normal.  He had not yet gotten his foot x-ray so that cannot be discussed.  He missed his appointment today on the morning of 7/29.  He reports that he forgot he had an appointment.  He continues to experience foot pain though he did buy hard soled shoes.  He is encouraged to follow-up with his foot x-rays and reschedule an appointment for next month.  [name], MD

## 2019-07-16 NOTE — Progress Notes (Deleted)
    Subjective:  Fred Reyes is a 28 y.o. male who presents to the [facility] today with a chief complaint of ***.   HPI:  Foot pain   HTN   Diabetes A1c   HDL   Concern for OSA   Chief Complaint noted Review of Symptoms - see HPI PMH - Smoking status noted.    Objective:  Physical Exam: There were no vitals taken for this visit.   Gen: ***NAD, resting comfortably CV: RRR with no murmurs appreciated Pulm: NWOB, CTAB with no crackles, wheezes, or rhonchi GI: Normal bowel sounds present. Soft, Nontender, Nondistended. MSK: no edema, cyanosis, or clubbing noted Skin: warm, dry Neuro: grossly normal, moves all extremities Psych: Normal affect and thought content  No results found for this or any previous visit (from the past 72 hour(s)).   Assessment/Plan:  No problem-specific Assessment & Plan notes found for this encounter.

## 2019-07-18 ENCOUNTER — Ambulatory Visit: Payer: PRIVATE HEALTH INSURANCE | Admitting: Family Medicine

## 2019-08-08 ENCOUNTER — Telehealth: Payer: Self-pay | Admitting: Family Medicine

## 2019-08-08 ENCOUNTER — Ambulatory Visit
Admission: RE | Admit: 2019-08-08 | Discharge: 2019-08-08 | Disposition: A | Discharge status: Home / Self Care | Payer: PRIVATE HEALTH INSURANCE | Source: Ambulatory Visit | Attending: Family Medicine | Admitting: Family Medicine

## 2019-08-08 DIAGNOSIS — M7989 Other specified soft tissue disorders: Secondary | ICD-10-CM

## 2019-08-08 NOTE — Telephone Encounter (Signed)
Please call patient with xray results.  [phone]

## 2019-08-08 NOTE — Telephone Encounter (Signed)
Will call pt when results are available.  [name], MD

## 2019-08-11 ENCOUNTER — Telehealth: Payer: PRIVATE HEALTH INSURANCE

## 2019-08-11 ENCOUNTER — Encounter: Payer: Self-pay | Admitting: Family Medicine

## 2019-08-22 ENCOUNTER — Ambulatory Visit: Payer: PRIVATE HEALTH INSURANCE | Admitting: Family Medicine

## 2019-08-28 ENCOUNTER — Encounter: Payer: Self-pay | Admitting: Family Medicine

## 2019-09-11 ENCOUNTER — Encounter: Payer: Self-pay | Admitting: Family Medicine

## 2019-09-11 ENCOUNTER — Other Ambulatory Visit: Payer: Self-pay

## 2019-09-11 ENCOUNTER — Ambulatory Visit (INDEPENDENT_AMBULATORY_CARE_PROVIDER_SITE_OTHER): Payer: PRIVATE HEALTH INSURANCE | Admitting: Family Medicine

## 2019-09-11 VITALS — BP 130/72 | HR 84 | Wt 348.0 lb

## 2019-09-11 DIAGNOSIS — M79671 Pain in right foot: Secondary | ICD-10-CM

## 2019-09-11 DIAGNOSIS — E119 Type 2 diabetes mellitus without complications: Secondary | ICD-10-CM

## 2019-09-11 LAB — POCT GLYCOSYLATED HEMOGLOBIN (HGB A1C): HbA1c, POC (controlled diabetic range): 6.5 % (ref 0.0–7.0)

## 2019-09-11 MED ORDER — NAPROXEN 500 MG PO TABS: 500.0000 mg | ORAL_TABLET | Freq: Two times a day (BID) | ORAL | 0 refills | Status: DC

## 2019-09-11 MED ORDER — METFORMIN HCL 500 MG PO TABS: 500.0000 mg | ORAL_TABLET | Freq: Two times a day (BID) | ORAL | 3 refills | Status: DC

## 2019-09-11 NOTE — Patient Instructions (Signed)
MyPlate from USDA  MyPlate is an outline of a general healthy diet based on the 2010 Dietary Guidelines for Americans, from the U.S. Department of Agriculture (USDA). It sets guidelines for how much food you should eat from each food group based on your age, sex, and level of physical activity. What are tips for following MyPlate? To follow MyPlate recommendations:  Eat a wide variety of fruits and vegetables, grains, and protein foods.  Serve smaller portions and eat less food throughout the day.  Limit portion sizes to avoid overeating.  Enjoy your food.  Get at least 150 minutes of exercise every week. This is about 30 minutes each day, 5 or more days per week. It can be difficult to have every meal look like MyPlate. Think about MyPlate as eating guidelines for an entire day, rather than each individual meal. Fruits and vegetables  Make half of your plate fruits and vegetables.  Eat many different colors of fruits and vegetables each day.  For a 2,000 calorie daily food plan, eat: ? 2 cups of vegetables every day. ? 2 cups of fruit every day.  1 cup is equal to: ? 1 cup raw or cooked vegetables. ? 1 cup raw fruit. ? 1 medium-sized orange, apple, or banana. ? 1 cup 100% fruit or vegetable juice. ? 2 cups raw leafy greens, such as lettuce, spinach, or kale. ?  cup dried fruit. Grains  One fourth of your plate should be grains.  Make at least half of the grains you eat each day whole grains.  For a 2,000 calorie daily food plan, eat 6 oz of grains every day.  1 oz is equal to: ? 1 slice bread. ? 1 cup cereal. ?  cup cooked rice, cereal, or pasta. Protein  One fourth of your plate should be protein.  Eat a wide variety of protein foods, including meat, poultry, fish, eggs, beans, nuts, and tofu.  For a 2,000 calorie daily food plan, eat 5 oz of protein every day.  1 oz is equal to: ? 1 oz meat, poultry, or fish. ?  cup cooked beans. ? 1 egg. ?  oz nuts  or seeds. ? 1 Tbsp peanut butter. Dairy  Drink fat-free or low-fat (1%) milk.  Eat or drink dairy as a side to meals.  For a 2,000 calorie daily food plan, eat or drink 3 cups of dairy every day.  1 cup is equal to: ? 1 cup milk, yogurt, cottage cheese, or soy milk (soy beverage). ? 2 oz processed cheese. ? 1 oz natural cheese. Fats, oils, salt, and sugars  Only small amounts of oils are recommended.  Avoid foods that are high in calories and low in nutritional value (empty calories), like foods high in fat or added sugars.  Choose foods that are low in salt (sodium). Choose foods that have less than 140 milligrams (mg) of sodium per serving.  Drink water instead of sugary drinks. Drink enough water each day to keep your urine pale yellow. Where to find support  Work with your health care provider or a nutrition specialist (dietitian) to develop a customized eating plan that is right for you.  Download an app (mobile application) to help you track your daily food intake. Where to find more information  Go to ChooseMyPlate.gov for more information.  Learn more and log your daily food intake according to MyPlate using USDA's SuperTracker: www.supertracker.usda.gov Summary  MyPlate is a general guideline for healthy eating from the USDA. It   is based on the 2010 Dietary Guidelines for Americans.  In general, fruits and vegetables should take up  of your plate, grains should take up  of your plate, and protein should take up  of your plate. This information is not intended to replace advice given to you by your health care provider. Make sure you discuss any questions you have with your health care provider. Document Released: 12/24/2007 Document Revised: 01/05/2018 Document Reviewed: 03/05/2017 Elsevier Patient Education  2020 Elsevier Inc.  

## 2019-09-11 NOTE — Progress Notes (Signed)
    Subjective:  Fred Reyes is a 28 y.o. male who presents to the [facility] today with a chief complaint of foot pain follow-up.   HPI: Right foot pain He reports mild improvement in his foot pain since last visit.  He now reports that he has a throbbing pain on the top of his foot that seems to be worse at the end of the day.  Generally this is not very bothersome at the beginning of his stay though is substantially worse following a shift of standing and working at [employer].  He did purchase a pair of stiff soled shoes which is contributed to mild improvement.  He would like to discuss what could be wrong with his foot if there is no evidence of fracture on x-ray.  There is also like to know referral to sports medicine would be helpful.  Diabetes, type II Current medication includes metformin 500 mg twice daily.  He reports adherence to this medication.  He was unsure what appropriate nutrition and exercise should look like given his heart murmur.  He is not currently participating in any kind of exercise regimen.  His most recent dinner is composed of 2 hot dogs, chips and Kool-Aid.  Chief Complaint noted Review of Symptoms - see HPI PMH -morbid obesity, diabetes   Objective:  Physical Exam: BP 130/72   Pulse 84   Wt (!) 348 lb (157.9 kg)   SpO2 97%   BMI 52.91 kg/m    Gen: Morbidly obese 28 year old man.  NAD. Pulm: NWOB, CTAB with no crackles, wheezes, or rhonchi GI: Normal bowel sounds present. Soft, Nontender, Nondistended. MSK: no edema, cyanosis, or clubbing noted Skin: warm, dry Neuro: grossly normal, moves all extremities Psych: Normal affect and thought content  Results for orders placed or performed in visit on 09/11/19 (from the past 72 hour(s))  HgB A1c     Status: None   Collection Time: 09/11/19  3:25 PM  Result Value Ref Range   Hemoglobin A1C     HbA1c POC (<> result, manual entry)     HbA1c, POC (prediabetic range)     HbA1c, POC (controlled diabetic range)  6.5 0.0 - 7.0 %     Assessment/Plan:  Right foot pain Mild improvement since last visit.  No evidence of fracture on foot x-ray.  Likely soft tissue.  Very likely secondary to morbid obesity.  Continue pain management with over-the-counter medication and naproxen.  Significant time was spent discussing weight loss.  Morbid obesity ([place]) My plate and exercise recommendations were discussed at length.  Handout was provided for my plate.  He reports that he previously had been given referral to diabetic education but did not attend. -My plate handout provided -Exercise 30 minutes daily at least 5 days a week -Goal weight loss of 10% of body weight, no more than 1 pound per week  Type 2 diabetes mellitus without complication, without long-term current use of insulin (HCC) A1c under 7.  Diet and exercise discussed at length. -Continue metformin 500 twice daily, no increase at this time -Return in 3 months -Can consider GLP-1 for weight loss

## 2019-09-12 NOTE — Assessment & Plan Note (Signed)
My plate and exercise recommendations were discussed at length.  Handout was provided for my plate.  He reports that he previously had been given referral to diabetic education but did not attend. -My plate handout provided -Exercise 30 minutes daily at least 5 days a week -Goal weight loss of 10% of body weight, no more than 1 pound per week

## 2019-09-12 NOTE — Assessment & Plan Note (Signed)
>>  ASSESSMENT AND PLAN FOR BILATERAL FOOT PAIN WRITTEN ON 09/12/2019  7:17 AM BY DEMPSEY COY, MD  Mild improvement since last visit.  No evidence of fracture on foot x-ray.  Likely soft tissue.  Very likely secondary to morbid obesity.  Continue pain management with over-the-counter medication and naproxen .  Significant time was spent discussing weight loss.

## 2019-09-12 NOTE — Assessment & Plan Note (Signed)
Mild improvement since last visit.  No evidence of fracture on foot x-ray.  Likely soft tissue.  Very likely secondary to morbid obesity.  Continue pain management with over-the-counter medication and naproxen.  Significant time was spent discussing weight loss.

## 2019-09-12 NOTE — Assessment & Plan Note (Addendum)
A1c under 7.  Diet and exercise discussed at length. -Continue metformin 500 twice daily, no increase at this time -Return in 3 months -Can consider GLP-1 for weight loss

## 2019-10-02 ENCOUNTER — Encounter: Payer: Self-pay | Admitting: Family Medicine

## 2019-10-03 ENCOUNTER — Telehealth: Payer: Self-pay | Admitting: Family Medicine

## 2019-10-03 DIAGNOSIS — M79671 Pain in right foot: Secondary | ICD-10-CM

## 2019-10-03 NOTE — Telephone Encounter (Signed)
This note was entered in error.

## 2019-10-08 ENCOUNTER — Other Ambulatory Visit: Payer: Self-pay

## 2019-10-08 ENCOUNTER — Ambulatory Visit: Payer: Self-pay

## 2019-10-08 ENCOUNTER — Encounter: Payer: Self-pay | Admitting: Sports Medicine

## 2019-10-08 ENCOUNTER — Ambulatory Visit (INDEPENDENT_AMBULATORY_CARE_PROVIDER_SITE_OTHER): Payer: PRIVATE HEALTH INSURANCE | Admitting: Sports Medicine

## 2019-10-08 ENCOUNTER — Encounter: Payer: Self-pay | Admitting: Family Medicine

## 2019-10-08 VITALS — BP 122/84 | Ht 69.0 in | Wt 350.0 lb

## 2019-10-08 DIAGNOSIS — M25571 Pain in right ankle and joints of right foot: Secondary | ICD-10-CM

## 2019-10-08 NOTE — Progress Notes (Addendum)
PCP: Mirian Mo, MD  Subjective:   HPI: Patient is a 28 y.o. male here for evaluation of right ankle pain.  Pain started several months ago.  Patient denies any injury or trauma to his ankle.  Pain is located on lateral aspect of his ankle and foot.  Patient endorses significant swelling of both of his feet.  He is unsure why this is the case.  He has not tried any sort of compression stockings or wraps for his feet.  He denies any bruising.  Patient denies any numbness or tingling.  The pain does not radiate.  He has not taken anything for his pain yet.  Patient notes it is very difficult to move his ankle at all.  He states he stands a lot for his job as he works at [employer].   Review of Systems: See HPI above.  Past Medical History:  Diagnosis Date  . Anxiety   . Asthma due to environmental allergies   . Constipation 12/18/2008   ED visit  . Depression, major, in remission (HCC)   . History of psychiatric hospitalization    3 months for suicide attempt  . Hypertension 04/16/2019  . Morbid obesity (HCC) 04/16/2019  . 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 Type 2 diabetes mellitus without complication, without long-term current use of insulin (HCC) 04/16/2019    Current Outpatient Medications on File Prior to Visit  Medication Sig Dispense Refill  . atorvastatin (LIPITOR) 40 MG tablet Take 1 tablet (40 mg total) by mouth daily. 90 tablet 3  . metFORMIN (GLUCOPHAGE) 500 MG tablet Take 1 tablet (500 mg total) by mouth 2 (two) times daily with a meal. 60 tablet 3  . naproxen (NAPROSYN) 500 MG tablet Take 1 tablet (500 mg total) by mouth 2 (two) times daily. 30 tablet 0  . ondansetron (ZOFRAN ODT) 4 MG disintegrating tablet Take 1 tablet (4 mg total) by mouth every 8 (eight) hours as needed for nausea or vomiting. 20 tablet 0  . ondansetron (ZOFRAN) 4 MG tablet Take 1 tablet (4 mg total) by mouth every 8 (eight) hours as needed for nausea or vomiting. 5 tablet 0  .  valsartan (DIOVAN) 80 MG tablet Take 1 tablet (80 mg total) by mouth at bedtime. 90 tablet 3   No current facility-administered medications on file prior to visit.     Past Surgical History:  Procedure Laterality Date  . NO PAST SURGERIES      No Known Allergies  Social History   Socioeconomic History  . Marital status: Married    Spouse name: Selinda Eon  . Number of children: 1  . Years of education: Not on file  . Highest education level: Not on file  Occupational History  . Not on file  Social Needs  . Financial resource strain: Not on file  . Food insecurity    Worry: Not on file    Inability: Not on file  . Transportation needs    Medical: Not on file    Non-medical: Not on file  Tobacco Use  . Smoking status: Former Smoker    Packs/day: 0.50    Years: 2.00    Pack years: 1.00  . Smokeless tobacco: Never Used  Substance and Sexual Activity  . Alcohol use: Yes    Alcohol/week: 7.0 standard drinks    Types: 7 Cans of beer per week    Frequency: Never  . Drug use: Not Currently    Types: Marijuana, Cocaine  Comment: no MJ or cocaine use in about 2 years. previously used cocaine about once/month  . Sexual activity: Yes  Lifestyle  . Physical activity    Days per week: Not on file    Minutes per session: Not on file  . Stress: Not on file  Relationships  . Social Herbalist on phone: Not on file    Gets together: Not on file    Attends religious service: Not on file    Active member of club or organization: Not on file    Attends meetings of clubs or organizations: Not on file    Relationship status: Not on file  . Intimate partner violence    Fear of current or ex partner: Not on file    Emotionally abused: Not on file    Physically abused: Not on file    Forced sexual activity: Not on file  Other Topics Concern  . Not on file  Social History Narrative  . Not on file    Family History  Adopted: Yes  Family history unknown: Yes         Objective:  Physical Exam: BP 122/84   Ht 5\' 9"  (1.753 m)   Wt (!) 350 lb (158.8 kg)   BMI 51.69 kg/m  Gen: NAD, comfortable in exam room Lungs: Breathing comfortably on room air Ankle/Foot Exam Right -Inspection: Significant soft tissue edema of the foot leading up to the knee.  There is no bruising.  Patient with significant pes planus and pronation deformity. -Palpation: Tenderness palpation at the lateral ankle posterior to the lateral malleolus -ROM: Patient unwilling to move his foot in dorsiflexion, plantarflexion, inversion or eversion -Strength: Unable to assess  Contralateral Ankle -Inspection: Significant soft tissue edema of the foot leading up to the knee.  There is no bruising.  Patient with significant pes planus and pronation deformity. -Palpation: Medial malleolus: non-tender; lateral malleolus: non-tender; 5th metatarsal: non-tender; calcaneus: non-tender; plantar fascia insertion: non-tender -ROM: Normal ROM with dorsiflexion, plantarflexion, inversion, eversion -Strength: Dorsiflexion: 5/5; Plantarflexion: 5/5; Inversion: 5/5; Eversion: 5/5 -Limb neurovascularly intact, no instability noted  Limited diagnostic ultrasound of the right ankle and foot Findings: -Small amount of fluid noted in the peroneus brevis tendon sheath at the level of the lateral malleolus -No tears of the peroneus brevis of the peroneus longus -Normal appearance of the base of the fifth metatarsal -No joint effusion noted in the right ankle -Significant amount of soft tissue edema present diffusely throughout the ankle and foot Impression: -Significant edema of the ankle and foot.  Small amount of fluid within the peroneus brevis tendon sheath.  Otherwise normal exam    Assessment & Plan:  Patient is a 28 y.o. male here for right ankle and foot pain  1.  Right ankle pain -Patient with significant edema of the lower extremity is likely contributing to his pain -Patient was  given a prescription for compression stockings to wear during the day -Patient was advised to keep his legs elevated at night when not wearing the compression stockings -Patient was given range of motion exercises for his ankle  Patient will follow-up in 2 weeks  Addendum:  Patient seen in the office by fellow.  His history, exam, plan of care were precepted with me.  Karlton Lemon MD Kirt Boys

## 2019-10-08 NOTE — Patient Instructions (Signed)
Your ultrasound today did not show any evidence of abnormalities your ankle.  However it did show you have a significant amount of soft tissue swelling in your ankle and foot -Pick up the compression stockings that we gave you a prescription for today.  You should wear these during the daytime, especially during periods of standing while at work -When you are not wearing the compression stockings try to keep your feet elevated -Work on the range of motion exercises shown to you at today's visit  We will see back in 2 weeks

## 2019-10-22 ENCOUNTER — Ambulatory Visit: Payer: PRIVATE HEALTH INSURANCE | Admitting: Sports Medicine

## 2019-10-26 ENCOUNTER — Encounter: Payer: Self-pay | Admitting: Family Medicine

## 2019-11-07 ENCOUNTER — Encounter: Payer: Self-pay | Admitting: Family Medicine

## 2019-11-07 ENCOUNTER — Ambulatory Visit (INDEPENDENT_AMBULATORY_CARE_PROVIDER_SITE_OTHER): Payer: PRIVATE HEALTH INSURANCE | Admitting: Family Medicine

## 2019-11-07 ENCOUNTER — Other Ambulatory Visit: Payer: Self-pay

## 2019-11-07 DIAGNOSIS — I10 Essential (primary) hypertension: Secondary | ICD-10-CM

## 2019-11-07 DIAGNOSIS — G4733 Obstructive sleep apnea (adult) (pediatric): Secondary | ICD-10-CM | POA: Insufficient documentation

## 2019-11-07 DIAGNOSIS — M79671 Pain in right foot: Secondary | ICD-10-CM

## 2019-11-07 DIAGNOSIS — Z23 Encounter for immunization: Secondary | ICD-10-CM | POA: Diagnosis not present

## 2019-11-07 DIAGNOSIS — E119 Type 2 diabetes mellitus without complications: Secondary | ICD-10-CM

## 2019-11-07 DIAGNOSIS — G4719 Other hypersomnia: Secondary | ICD-10-CM

## 2019-11-07 MED ORDER — SEMAGLUTIDE 3 MG PO TABS: 3.0000 mg | ORAL_TABLET | Freq: Every day | ORAL | 0 refills | Status: DC

## 2019-11-07 NOTE — Progress Notes (Signed)
Subjective:  Fred Reyes is a 28 y.o. male who presents to the [facility] today to follow up on his diabetes and general health.   HPI: DM2: A1c at last visit was 6.5. He states that he is motivated to lose weight to control his chronic diseases. He reports pain when playing with his daughter as a motivating factor. He reports poor diet, including cookies, candies, and sugar-sweetened beverages. He is drinking approximately 32oz of orange juice per day. He also reports very limited exercise. He is willing to make discrete changes to his diet and exercise and come in for regular follow-ups.   Foot pain: He is still experiencing pain of his right foot. He stands at work for eight hours at a time. He has relief of symptoms with ibuprofen and elevation at the end of the day. He has received a work-up with sports med and he understands that weight loss is the best course to relieve his symptoms.   Daytime Sleepiness: Was unable to schedule sleep study in early days of pandemic. Interesting in scheduling one as soon as possible, as daytime sleepiness has persisted.   Morbid obesity He recognizes that all of the above problems are related to his obesity and is interested in losing weight.  In addition to dietary changes, he would like to know if there are any medication options that would be helpful for weight loss.  Health Maintenance: Wanting to get flu shot today.   Social hx: Alcohol use- His alcohol use has decreased from 7 drinks/week to 0 drinks/week.   Chief Complaint noted Review of Symptoms - see HPI  Objective:  Physical Exam: BP 110/62   Pulse 91   Wt (!) 351 lb 2 oz (159.3 kg)   SpO2 97%   BMI 51.85 kg/m    General: Somnolent, fell asleep several times during visit.  Morbidly obese. Cardiac: RRR, normal heart sounds Pulm: normal effort breathing comfortably on room air, CTAB Extremities: Foot exam not performed per patient request.  Skin: Warm, dry  No results  found for this or any previous visit (from the past 72 hour(s)).   Assessment/Plan:  Type 2 diabetes mellitus without complication, without long-term current use of insulin (HCC) We discussed several action items for him to implement before following up in six weeks. He is willing to cut out cookies and candies and to replace gatorade and orange juice with zero-carb options. He is also willing to take two fifteen-minute walks around the block with his daughter each day. He is willing to schedule regular, more frequent follow-ups to stay on top of these interventions. He seems to have an understanding of what constitutes a healthy lifestyle and just needs some encouragement to implement and stick to these changes.   Begin Ozempic (semaglutide) today for diabetes control and weight loss.  Will prescribe 3 mg daily for the next month.  If he tolerates well without GI symptoms, will increase to 7 mg next month.   Right foot pain Symptoms are likely to abate with weight loss.  We also discussed the appropriate use of compression socks during the day.  Morbid obesity (HCC) Semaglutide started today as noted under the diabetes problem.  Excessive daytime sleepiness Body habitus and symptoms highly suspicious for obstructive sleep apnea and/or obesity hypoventilation syndrome.  Sleep study pending.  Temporarily postponed her issues related to Covid.  Hypertension Well-controlled on current regimen.  Continue losartan.  Health Maintenance: - Flu shot administered in clinic.   Solmon Ice  Research officer, political party  The initial note writing and documentation were done by Pearla Dubonnet, medical student.  I have taken over the note and made alterations to the plan and medication management as appropriate.  [name], MD

## 2019-11-07 NOTE — Progress Notes (Signed)
  Subjective:  Patient ID: Fred Reyes  DOB: 09-21-1991 MRN: [id number]  Fred Reyes is a 27 y.o. male with a PMH of diabetes, hypertension, chronic foot pain, here today for diabetes follow-up.   HPI:  DM2: A1c at last visit was 6.5. He states that he is motivated to lose weight to control his chronic diseases. He reports pain when playing with his daughter as a motivating factor. He reports poor diet, including cookies, candies, and sugar-sweetened beverages. He is drinking approximately 32oz of orange juice per day. He also reports very limited exercise. He is willing to make discrete changes to his diet and exercise and come in for regular follow-ups.   Foot pain: He is still experiencing pain of his right foot. He stands at work for eight hours at a time. He has relief of symptoms with ibuprofen and elevation at the end of the day. He has received a work-up with sports med and he understands that weight loss is the best course to relieve his symptoms.   Daytime Sleepiness: Was unable to schedule sleep study in early days of pandemic. Interesting in scheduling one as soon as possible, as daytime sleepiness has persisted.   Morbid obesity He recognizes that all of the above problems are related to his obesity and is interested in losing weight.  In addition to dietary changes, he would like to know if there are any medication options that would be helpful for weight loss.  Health Maintenance: Wanting to get flu shot today.   Social hx: Alcohol use- His alcohol use has decreased from 7 drinks/week to 0 drinks/week.      Objective:  BP 110/62   Pulse 91   Wt (!) 351 lb 2 oz (159.3 kg)   SpO2 97%   BMI 51.85 kg/m   Vitals and nursing note reviewed  General: Somnolent, fell asleep several times during visit Cardiac: RRR, normal heart sounds, no m/r/g Pulm: normal effort, CTAB Extremities: Foot exam not performed per patient request.    Assessment & Plan:   DM2: We  discussed several action items for him to implement before following up in six weeks. He is willing to cut out cookies and candies and to replace gatorade and orange juice with zero-carb options. He is also willing to take two fifteen-minute walks around the block with his daughter each day. He is willing to schedule regular, more frequent follow-ups to stay on top of these interventions. He seems to have an understanding of what constitutes a healthy lifestyle and just needs some encouragement to implement and stick to these changes.   Foot pain: - Symptoms are likely to abate with weight loss.   Daytime Sleepiness: - Will follow-up on referral for sleep study. Treating any underlying OSA would also benefit his weight loss.   HTN: - Well controlled on current regimen. Continue.   Health Maintenance: - Flu shot administered in clinic.   Marnee Guarneri Medical Student

## 2019-11-07 NOTE — Assessment & Plan Note (Signed)
Semaglutide started today as noted under the diabetes problem.

## 2019-11-07 NOTE — Assessment & Plan Note (Signed)
>>  ASSESSMENT AND PLAN FOR BILATERAL FOOT PAIN WRITTEN ON 11/07/2019  6:02 PM BY DEMPSEY COY, MD  Symptoms are likely to abate with weight loss.  We also discussed the appropriate use of compression socks during the day.

## 2019-11-07 NOTE — Assessment & Plan Note (Signed)
>>  ASSESSMENT AND PLAN FOR EXCESSIVE DAYTIME SLEEPINESS WRITTEN ON 11/07/2019  6:04 PM BY DEMPSEY COY, MD  Body habitus and symptoms highly suspicious for obstructive sleep apnea and/or obesity hypoventilation syndrome.  Sleep study pending.  Temporarily postponed her issues related to Covid.

## 2019-11-07 NOTE — Patient Instructions (Signed)
Is a quick summary of what we talked about today:  Nutrition: Here this 3 nutrition goals were made for this visit. -Replace orange juice and Gatorade with a low sugar or no sugar option -No cookies -No candy  Possible obstructive sleep apnea: We will follow-up on her sleep study to make sure we are giving you the best treatment.  Diabetes/weight loss: You are starting a new medication today called semaglutide (Ozempic).  He will start taking 1 pill daily for the next month.  Please keep a look out for possible side effects which would include nausea, vomiting, diarrhea.  If you tolerate the medication well for 1 month, we will increase your dose.  There is a medication that will help with your diabetes and with weight loss.

## 2019-11-07 NOTE — Assessment & Plan Note (Signed)
Symptoms are likely to abate with weight loss.  We also discussed the appropriate use of compression socks during the day.

## 2019-11-07 NOTE — Assessment & Plan Note (Signed)
Well-controlled on current regimen.  Continue losartan.

## 2019-11-07 NOTE — Assessment & Plan Note (Signed)
We discussed several action items for him to implement before following up in six weeks. He is willing to cut out cookies and candies and to replace gatorade and orange juice with zero-carb options. He is also willing to take two fifteen-minute walks around the block with his daughter each day. He is willing to schedule regular, more frequent follow-ups to stay on top of these interventions. He seems to have an understanding of what constitutes a healthy lifestyle and just needs some encouragement to implement and stick to these changes.   Begin Ozempic (semaglutide) today for diabetes control and weight loss.  Will prescribe 3 mg daily for the next month.  If he tolerates well without GI symptoms, will increase to 7 mg next month.

## 2019-11-07 NOTE — Assessment & Plan Note (Signed)
Body habitus and symptoms highly suspicious for obstructive sleep apnea and/or obesity hypoventilation syndrome.  Sleep study pending.  Temporarily postponed her issues related to Covid.

## 2019-12-04 ENCOUNTER — Encounter: Payer: Self-pay | Admitting: Family Medicine

## 2019-12-04 ENCOUNTER — Other Ambulatory Visit: Payer: Self-pay

## 2019-12-04 ENCOUNTER — Ambulatory Visit (INDEPENDENT_AMBULATORY_CARE_PROVIDER_SITE_OTHER): Payer: PRIVATE HEALTH INSURANCE | Admitting: Family Medicine

## 2019-12-04 VITALS — BP 126/84 | HR 101 | Wt 353.0 lb

## 2019-12-04 DIAGNOSIS — M79671 Pain in right foot: Secondary | ICD-10-CM | POA: Diagnosis not present

## 2019-12-04 DIAGNOSIS — E119 Type 2 diabetes mellitus without complications: Secondary | ICD-10-CM

## 2019-12-04 DIAGNOSIS — G4719 Other hypersomnia: Secondary | ICD-10-CM

## 2019-12-04 DIAGNOSIS — M79672 Pain in left foot: Secondary | ICD-10-CM

## 2019-12-04 NOTE — Patient Instructions (Signed)
It was a pleasure to see you today! Thank you for choosing [facility] for your primary care. Shirlee Latch was seen for 2 issues. Come back to the clinic in a month if things not improving.  Today we talked about a lot of issues.  For the swelling in your foot and the pain you are having were going to write you a prescription for compression stockings you can want to try something in the 10 to 20 mm range.   I also think there is a chance that you have a call flatfeet.  We looked on the Internet to try and find some versions of an arch support that you can put in your shoe which should help out with some of this.  I also think that weight is contributing to your issue so were going to have you speak to the nutritionist.  And as you mentioned that you are supposed to go and get a sleep apnea study but lost contact with them, we put that referral in again as well.   Please bring all your medications to every doctors visit   Sign up for My Chart to have easy access to your labs results, and communication with your Primary care physician.     Please check-out at the front desk before leaving the clinic.     Best,  Dr. Sherene Sires FAMILY MEDICINE RESIDENT - PGY3 12/04/2019 3:35 PM

## 2019-12-06 NOTE — Assessment & Plan Note (Signed)
Discussed that they have not scheduled with sleep study,, we went over how important this is and have replaced the referral.  They said they will follow-up.  Still having daytime somnolence, definitely been told that they snore, do not have any particular knowledge of apneic episodes

## 2019-12-06 NOTE — Assessment & Plan Note (Signed)
>>  ASSESSMENT AND PLAN FOR EXCESSIVE DAYTIME SLEEPINESS WRITTEN ON 12/06/2019 10:05 AM BY BLAND, SCOTT, DO  Discussed that they have not scheduled with sleep study,, we went over how important this is and have replaced the referral.  They said they will follow-up.  Still having daytime somnolence, definitely been told that they snore, do not have any particular knowledge of apneic episodes

## 2019-12-06 NOTE — Assessment & Plan Note (Signed)
>>  ASSESSMENT AND PLAN FOR BILATERAL FOOT PAIN WRITTEN ON 12/06/2019 10:16 AM BY BLAND, SCOTT, DO  Multifactorial, BMI 52, pes planus, pitting bilateral lower edema of undetermined etiology at this point.  No sign of current infection or obvious traumatic injury, patient does have significant pes planus for which we will start with over-the-counter arch support inserts in his shoe.  We also advised trying a lower rated compression stocking because the last ones hurt him, we are going to write a hardcopy prescription for this but patient left prior to being able to grab it.  Patient declines referral for custom orthotics and says he will try buying some online first.  With regard to bilateral pitting edema there could be some contribution of cardiac although has no prior diagnosis of CHF, after the patient left on further chart review it was noted that he had missed a follow-up for a cardiac MRI after diagnosis of left ventricular hypertrophy in June.  I did call the patient and asked him to call back so that we could help him schedule his follow-up testing.  We are also referring to nutrition services for help with weight control, patient declines bariatric referral but would be appropriate for 1 should he choose to except in the future

## 2019-12-06 NOTE — Progress Notes (Signed)
Subjective:  Fred Reyes is a 28 y.o. male who presents to the [facility] today with a chief complaint of bilateral foot pain.   HPI: Bilateral foot pain Patient said his feet have been hurting for "as long as I can remember ".  Says that he usually just goes along with it and tries to put his feet up.  Says it has been getting slowly worse over the last few months.  Denies any new injuries, denies any redness or signs of infection.  Denies any sensation changes.  States that he thinks this is pressure related because it feels like his "skin has pressure on it ".  Has been prescribed compression stockings in the past but did not wear them because he felt they were too firm.  Objective:  Physical Exam: BP 126/84   Pulse (!) 101   Wt (!) 353 lb (160.1 kg)   SpO2 98%   BMI 52.13 kg/m   Gen: NAD, conversing comfortably CV: RRR with 2/6 murmur Pulm: NWOB, CTAB with no crackles, wheezes, or rhonchi GI: Normal bowel sounds present. Soft, Nontender, Nondistended. MSK: Pes planus bilaterally, no noted significant valgus or varus deviation of the ankle.  Very minimal skin changing consistent with a potential venous stasis, pitting edema 1-2, sensation intact, motor control intact. Skin: warm, dry Neuro: grossly normal, moves all extremities Psych: Normal affect and thought content  No results found for this or any previous visit (from the past 72 hour(s)).   Assessment/Plan:  Excessive daytime sleepiness Discussed that they have not scheduled with sleep study,, we went over how important this is and have replaced the referral.  They said they will follow-up.  Still having daytime somnolence, definitely been told that they snore, do not have any particular knowledge of apneic episodes  Morbid obesity (HCC) We discussed I believe this is contributing to OSA and potentially obesity hypoventilation as well as his foot pain.  Patient accepts referral to nutrition services, we did discuss that  he would also meet criteria for bariatric referral but he wants to hold off at this time.  Type 2 diabetes mellitus without complication, without long-term current use of insulin (HCC) Diabetes is actually well controlled at this time, but given this diagnosis as well as morbid obesity will be referring to nutrition services for additional support.  Bilateral foot pain Multifactorial, BMI 52, pes planus, pitting bilateral lower edema of undetermined etiology at this point.  No sign of current infection or obvious traumatic injury, patient does have significant pes planus for which we will start with over-the-counter arch support inserts in his shoe.  We also advised trying a lower rated compression stocking because the last ones hurt him, we are going to write a hardcopy prescription for this but patient left prior to being able to grab it.  Patient declines referral for custom orthotics and says he will try buying some online first.  With regard to bilateral pitting edema there could be some contribution of cardiac although has no prior diagnosis of CHF, after the patient left on further chart review it was noted that he had missed a follow-up for a cardiac MRI after diagnosis of left ventricular hypertrophy in June.  I did call the patient and asked him to call back so that we could help him schedule his follow-up testing.  We are also referring to nutrition services for help with weight control, patient declines bariatric referral but would be appropriate for 1 should he choose to except  in the future   Sherene Sires, [place] - PGY3 12/06/2019 10:16 AM

## 2019-12-06 NOTE — Assessment & Plan Note (Signed)
We discussed I believe this is contributing to OSA and potentially obesity hypoventilation as well as his foot pain.  Patient accepts referral to nutrition services, we did discuss that he would also meet criteria for bariatric referral but he wants to hold off at this time.

## 2019-12-06 NOTE — Assessment & Plan Note (Signed)
Diabetes is actually well controlled at this time, but given this diagnosis as well as morbid obesity will be referring to nutrition services for additional support.

## 2019-12-06 NOTE — Assessment & Plan Note (Addendum)
Multifactorial, BMI 52, pes planus, pitting bilateral lower edema of undetermined etiology at this point.  No sign of current infection or obvious traumatic injury, patient does have significant pes planus for which we will start with over-the-counter arch support inserts in his shoe.  We also advised trying a lower rated compression stocking because the last ones hurt him, we are going to write a hardcopy prescription for this but patient left prior to being able to grab it.  Patient declines referral for custom orthotics and says he will try buying some online first.  With regard to bilateral pitting edema there could be some contribution of cardiac although has no prior diagnosis of CHF, after the patient left on further chart review it was noted that he had missed a follow-up for a cardiac MRI after diagnosis of left ventricular hypertrophy in June.  I did call the patient and asked him to call back so that we could help him schedule his follow-up testing.  We are also referring to nutrition services for help with weight control, patient declines bariatric referral but would be appropriate for 1 should he choose to except in the future

## 2019-12-08 ENCOUNTER — Telehealth: Payer: Self-pay

## 2019-12-08 NOTE — Telephone Encounter (Signed)
Pt calls stating that he missed a phone call from Dr. Criss Rosales regarding instructions for follow up and labs. Pt informed of referrals made for sleep study and nutrition and diabetes. However, patient was unsure of plan. Requests provider to call back.   Please advise. Talbot Grumbling, RN'

## 2019-12-09 NOTE — Telephone Encounter (Signed)
In addition to sleep study and nutrition referal, patient has also missed a cardiac MRI that cardiology wanted to get done.  Order appears to still be current, please help patient schedule.  -Dr. Criss Rosales

## 2019-12-10 ENCOUNTER — Telehealth: Payer: Self-pay | Admitting: *Deleted

## 2019-12-10 NOTE — Telephone Encounter (Signed)
Mychart message sent to patient asking him if he is still interested in this MRI.  Deloyce Walthers,CMA

## 2019-12-10 NOTE — Telephone Encounter (Signed)
Sent mychart message to patient to inquire if he still wants this MRI.  Fred Reyes,CMA

## 2019-12-10 NOTE — Telephone Encounter (Signed)
-----   Message from Sherene Sires, DO sent at 12/06/2019 10:18 AM EST ----- Dr. Levonne Lapping... [place] please call patient, upon further chart review after his visit I found he had been lost to follow-up for a cardiac MRI that the cardiologist wanted done.  It looks like the order is still valid, I left a message for him to call back so that we could try and complete some missed testing.  Please try and help him schedule this MRI if he is willing.

## 2020-01-01 ENCOUNTER — Ambulatory Visit (INDEPENDENT_AMBULATORY_CARE_PROVIDER_SITE_OTHER): Payer: PRIVATE HEALTH INSURANCE | Admitting: Neurology

## 2020-01-01 ENCOUNTER — Other Ambulatory Visit: Payer: Self-pay

## 2020-01-01 ENCOUNTER — Encounter: Payer: Self-pay | Admitting: Neurology

## 2020-01-01 VITALS — BP 154/93 | HR 94 | Temp 97.4°F | Ht 69.0 in | Wt 351.0 lb

## 2020-01-01 DIAGNOSIS — R351 Nocturia: Secondary | ICD-10-CM

## 2020-01-01 DIAGNOSIS — R519 Headache, unspecified: Secondary | ICD-10-CM

## 2020-01-01 DIAGNOSIS — M7989 Other specified soft tissue disorders: Secondary | ICD-10-CM | POA: Diagnosis not present

## 2020-01-01 DIAGNOSIS — Z6841 Body Mass Index (BMI) 40.0 and over, adult: Secondary | ICD-10-CM

## 2020-01-01 DIAGNOSIS — R400 Somnolence: Secondary | ICD-10-CM

## 2020-01-01 DIAGNOSIS — R0683 Snoring: Secondary | ICD-10-CM

## 2020-01-01 NOTE — Progress Notes (Signed)
Subjective:    Patient ID: Fred Reyes is a 29 y.o. male.  HPI     Huston Foley, MD, PhD [facility] [address], [address] [address] [place], [place] [address]  Dear Drs. Parke Simmers and Chambliss,   I saw your patient, Fred Reyes, upon your kind request in my sleep clinic today for initial consultation of his sleep disorder, in particular, concern for underlying obstructive sleep apnea. The patient is unaccompanied today. As you know, Fred Reyes is a 29 year old right-handed gentleman with an underlying medical history of hypertension, myxomatous degeneration of the mitral valve, depression, LVH, LE edema, foot pain, anxiety, asthma, allergies, type 2 diabetes and morbid obesity with a BMI of over 50, who reports snoring and excessive daytime somnolence. I reviewed your office note from 12/04/2019. His Epworth sleepiness score is 18 out of 24, fatigue severity score is 40 out of 63.  He lives with his wife and 29-year-old daughter.  He reports that his snoring can be loud and wife confirmed over speaker phone in the beginning of our appointment.  She also reported that he used to have pauses in his breathing but not lately.  The patient reports that his weight has been fluctuating.  As I understand, he has declined bariatric referral.  He reports no smoking, he quit some years ago, he drinks caffeine daily in the form of soda and sweet tea, several servings per day.  He works for a [employer], in [employer].  He reports longer standing history of foot pain bilaterally, right much worse than left as well as swelling on the right side.  He reports that he had x-rays done and also ultrasound.  He has seen a cardiologist.  He wakes up with headaches at times.  He gets up to use the bathroom at night 2-3 times on average.  He is not aware of any family history as he is adopted.  His bedtime is between 11 and midnight and rise time around 630.  He does not wake up  rested.  He has dozed off at work even.  His Past Medical History Is Significant For: Past Medical History:  Diagnosis Date  . Anxiety   . Asthma due to environmental allergies   . Constipation 12/18/2008   ED visit  . Depression, major, in remission (HCC)   . History of psychiatric hospitalization    3 months for suicide attempt  . Hypertension 04/16/2019  . Morbid obesity (HCC) 04/16/2019  . 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 Type 2 diabetes mellitus without complication, without long-term current use of insulin (HCC) 04/16/2019    His Past Surgical History Is Significant For: Past Surgical History:  Procedure Laterality Date  . NO PAST SURGERIES      His Family History Is Significant For: Family History  Adopted: Yes  Family history unknown: Yes    His Social History Is Significant For: Social History   Socioeconomic History  . Marital status: Married    Spouse name: Selinda Eon  . Number of children: 1  . Years of education: Not on file  . Highest education level: Not on file  Occupational History  . Not on file  Tobacco Use  . Smoking status: Former Smoker    Packs/day: 0.50    Years: 2.00    Pack years: 1.00  . Smokeless tobacco: Never Used  Substance and Sexual Activity  . Alcohol use: Yes    Alcohol/week: 7.0 standard drinks  Types: 7 Cans of beer per week  . Drug use: Not Currently    Types: Marijuana, Cocaine    Comment: no MJ or cocaine use in about 2 years. previously used cocaine about once/month  . Sexual activity: Yes  Other Topics Concern  . Not on file  Social History Narrative  . Not on file   Social Determinants of Health   Financial Resource Strain:   . Difficulty of Paying Living Expenses: Not on file  Food Insecurity:   . Worried About Charity fundraiser in the Last Year: Not on file  . Ran Out of Food in the Last Year: Not on file  Transportation Needs:   . Lack of Transportation (Medical): Not on file   . Lack of Transportation (Non-Medical): Not on file  Physical Activity:   . Days of Exercise per Week: Not on file  . Minutes of Exercise per Session: Not on file  Stress:   . Feeling of Stress : Not on file  Social Connections:   . Frequency of Communication with Friends and Family: Not on file  . Frequency of Social Gatherings with Friends and Family: Not on file  . Attends Religious Services: Not on file  . Active Member of Clubs or Organizations: Not on file  . Attends Archivist Meetings: Not on file  . Marital Status: Not on file    His Allergies Are:  No Known Allergies:   His Current Medications Are:  Outpatient Encounter Medications as of 01/01/2020  Medication Sig  . atorvastatin (LIPITOR) 40 MG tablet Take 1 tablet (40 mg total) by mouth daily.  . metFORMIN (GLUCOPHAGE) 500 MG tablet Take 1 tablet (500 mg total) by mouth 2 (two) times daily with a meal.  . naproxen (NAPROSYN) 500 MG tablet Take 1 tablet (500 mg total) by mouth 2 (two) times daily.  . ondansetron (ZOFRAN) 4 MG tablet Take 1 tablet (4 mg total) by mouth every 8 (eight) hours as needed for nausea or vomiting.  . Semaglutide 3 MG TABS Take 3 mg by mouth daily. Take one tablet on empty stomach first thing in the AM  . valsartan (DIOVAN) 80 MG tablet Take 1 tablet (80 mg total) by mouth at bedtime.  . [DISCONTINUED] ondansetron (ZOFRAN ODT) 4 MG disintegrating tablet Take 1 tablet (4 mg total) by mouth every 8 (eight) hours as needed for nausea or vomiting.   No facility-administered encounter medications on file as of 01/01/2020.  :  Review of Systems:  Out of a complete 14 point review of systems, all are reviewed and negative with the exception of these symptoms as listed below: Review of Systems  Neurological:       Sleep consult. No prior sleep study- snoring is present. Epworth Sleepiness Scale 0= would never doze 1= slight chance of dozing 2= moderate chance of dozing 3= high chance of  dozing  Sitting and reading:2 Watching TV:2 Sitting inactive in a public place (ex. Theater or meeting):3 As a passenger in a car for an hour without a break:3 Lying down to rest in the afternoon:2 Sitting and talking to someone:2 Sitting quietly after lunch (no alcohol):2 In a car, while stopped in traffic:2 Total: 18    Objective:  Neurological Exam  Physical Exam Physical Examination:   Vitals:   01/01/20 1532  BP: (!) 154/93  Pulse: 94  Temp: (!) 97.4 F (36.3 C)    General Examination: The patient is a very pleasant 29 y.o. male  in no acute distress. He appears well-developed and well-nourished and adequately groomed. He was provided a clinic wheelchair as he was limping and reported right foot pain.  HEENT: Normocephalic, atraumatic, pupils are equal, round and reactive to light, extraocular tracking is good without limitation to gaze excursion or nystagmus noted. Hearing is grossly intact. Face is symmetric with normal facial animation. Speech is clear with no dysarthria noted. There is no hypophonia. There is no lip, neck/head, jaw or voice tremor. Neck is supple with full range of passive and active motion. There are no carotid bruits on auscultation. Oropharynx exam reveals: mild mouth dryness, adequate dental hygiene and moderate airway crowding, due to Larger tongue, redundant soft palate with Mallampati class III, smaller tonsils noted, slightly larger uvula, tongue protrudes centrally in palate elevates symmetrically, neck circumference is 20-3/8 inches.  Chest: Clear to auscultation without wheezing, rhonchi or crackles noted.  Heart: S1+S2+0, regular and normal without murmurs, rubs or gallops noted.   Abdomen: Soft, non-tender and non-distended with normal bowel sounds appreciated on auscultation.  Extremities: There is 2+ pitting edema in the distalRight lower extremity, 1+ in the distal left lower extremity, Mild erythema on the right side, chronic thickening  of the skin noted on the left side.   Skin: Warm and dry without trophic changes noted.   Musculoskeletal: exam reveals no obvious joint deformities, tenderness or joint swelling or erythema.   Neurologically:  Mental status: The patient is awake, alert and oriented in all 4 spheres. His immediate and remote memory, attention, language skills and fund of knowledge are appropriate. There is no evidence of aphasia, agnosia, apraxia or anomia. Speech is clear with normal prosody and enunciation. Thought process is linear. Mood is normal and affect is normal.  Cranial nerves II - XII are as described above under HEENT exam.  Motor exam: Normal bulk, strength and tone is noted. There is no tremor, Romberg is Not testable safely as he reports foot pain and is not able to stand narrow based.  Fine motor skills are preserved in the upper extremities.  He stands up out of his wheelchair slowly, he pushes himself up, he reports pain in both feet, right more than left and has a significant limp on the right.  He did not bring a cane, Reports he does not use a cane, does not use a wheelchair typically.  Assessment and Plan:  In summary, Lydell Moga is a very pleasant 29 y.o.-year old male with an underlying medical history of hypertension, myxomatous degeneration of the mitral valve, depression, LVH, LE edema, foot pain, anxiety, asthma, allergies, type 2 diabetes and morbid obesity with a BMI of over 50, whose history and physical exam are concerning for obstructive sleep apnea (OSA). I had a long chat with the patient about my findings and the diagnosis of OSA, its prognosis and treatment options. We talked about medical treatments, surgical interventions and non-pharmacological approaches. I explained in particular the risks and ramifications of untreated moderate to severe OSA, especially with respect to developing cardiovascular disease down the Road, including congestive heart failure, difficult to treat  hypertension, cardiac arrhythmias, or stroke. Even type 2 diabetes has, in part, been linked to untreated OSA. Symptoms of untreated OSA include daytime sleepiness, memory problems, mood irritability and mood disorder such as depression and anxiety, lack of energy, as well as recurrent headaches, especially morning headaches. We talked about Caffeine reduction and trying to maintain a healthy lifestyle in general, as well as the importance of weight  control. We also talked about the importance of good sleep hygiene. I recommended the following at this time: sleep study.  I explained the sleep test procedure to the patient and also outlined possible surgical and non-surgical treatment options of OSA, including the CPAP. He indicated that he would be willing to try CPAP if the need arises. I explained the importance of being compliant with PAP treatment, not only for insurance purposes but primarily to improve His symptoms, and for the patient's long term health benefit, including to reduce His cardiovascular risks. He Is advised to follow-up with you regarding weight management and possible options for him as well as for his right leg pain and significant swelling noted.  I answered all his questions today and the patient was in agreement. I plan to see him back after the sleep study is completed and encouraged him to call with any interim questions, concerns, problems or updates.   Thank you very much for allowing me to participate in the care of this nice patient. If I can be of any further assistance to you please do not hesitate to call me at [phone].  Sincerely,   Huston Foley, MD, PhD

## 2020-01-01 NOTE — Patient Instructions (Signed)

## 2020-01-02 ENCOUNTER — Encounter: Payer: Self-pay | Admitting: Family Medicine

## 2020-01-02 ENCOUNTER — Ambulatory Visit (HOSPITAL_COMMUNITY)
Admission: EM | Admit: 2020-01-02 | Discharge: 2020-01-02 | Disposition: A | Discharge status: Home / Self Care | Payer: PRIVATE HEALTH INSURANCE | Attending: Emergency Medicine | Admitting: Emergency Medicine

## 2020-01-02 ENCOUNTER — Encounter (HOSPITAL_COMMUNITY): Payer: Self-pay

## 2020-01-02 DIAGNOSIS — M722 Plantar fascial fibromatosis: Secondary | ICD-10-CM

## 2020-01-02 MED ORDER — MELOXICAM 7.5 MG PO TABS: 7.5000 mg | ORAL_TABLET | Freq: Every day | ORAL | 0 refills | Status: DC

## 2020-01-02 NOTE — ED Notes (Signed)
Staff had to redo discharge because patient left his coat in the exam room 5 and I wanted to let him know.

## 2020-01-02 NOTE — Discharge Instructions (Signed)
Wear supportive shoes and arch support with your work shoes.  Use of ace wrap to help with compression and support.  Massage under the foot with a tennis ball as discussed.  Meloxicam daily. Don't take additional ibuprofen for aleve. Take with food.  Continue to follow with your PCP and/or sports medicine for persistent symptoms.

## 2020-01-02 NOTE — ED Triage Notes (Signed)
Pt presents to UC with right foot pain x 4-5 months, no known injury. Pt states the pain is worse when he walks.

## 2020-01-02 NOTE — ED Provider Notes (Signed)
[facility]    CSN: [id number] Arrival date & time: 01/02/20  1058      History   Chief Complaint Chief Complaint  Patient presents with  . Appointment    1100  . Foot Pain    HPI Fred Reyes is a 29 y.o. male.   Dennison Mascot presents with complaints of right foot pain which has been persistent for months now, at least the past 5 months. Worse when standing on his feet. Has had xrays done which were normal. No known injury. He works at [employer], therefore stands a lot. Wears non-skid shoes to work. Pain is severe when he gets out of bed and steps onto the ground. No specific known swelling or redness. Hasn't been taking any medications for his pain. History  Of htn and DM. Obesity.     ROS per HPI, negative if not otherwise mentioned.      Past Medical History:  Diagnosis Date  . Anxiety   . Asthma due to environmental allergies   . Constipation 12/18/2008   ED visit  . Depression, major, in remission (HCC)   . History of psychiatric hospitalization    3 months for suicide attempt  . Hypertension 04/16/2019  . Morbid obesity (HCC) 04/16/2019  . 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 Type 2 diabetes mellitus without complication, without long-term current use of insulin (HCC) 04/16/2019    Patient Active Problem List   Diagnosis Date Noted  . Excessive daytime sleepiness 11/07/2019  . Bilateral foot pain 07/09/2019  . LVH (left ventricular hypertrophy) 06/12/2019  . Hyperlipidemia associated with type 2 diabetes mellitus (HCC) 06/12/2019  . Morbid obesity (HCC) 04/16/2019  . Hypertension 04/16/2019  . 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 Type 2 diabetes mellitus without complication, without long-term current use of insulin (HCC) 04/16/2019    Past Surgical History:  Procedure Laterality Date  . NO PAST SURGERIES         Home Medications    Prior to Admission medications   Medication Sig Start Date End  Date Taking? Authorizing Provider  atorvastatin (LIPITOR) 40 MG tablet Take 1 tablet (40 mg total) by mouth daily. 06/12/19   Diallo, Lilia Argue, MD  meloxicam (MOBIC) 7.5 MG tablet Take 1 tablet (7.5 mg total) by mouth daily. 01/02/20   Georgetta Haber, NP  metFORMIN (GLUCOPHAGE) 500 MG tablet Take 1 tablet (500 mg total) by mouth 2 (two) times daily with a meal. 09/11/19   Mirian Mo, MD  ondansetron (ZOFRAN) 4 MG tablet Take 1 tablet (4 mg total) by mouth every 8 (eight) hours as needed for nausea or vomiting. 11/25/18   Couture, Cortni S, PA-C  Semaglutide 3 MG TABS Take 3 mg by mouth daily. Take one tablet on empty stomach first thing in the AM 11/07/19   Mirian Mo, MD  valsartan (DIOVAN) 80 MG tablet Take 1 tablet (80 mg total) by mouth at bedtime. 04/16/19   Moses Manners, MD    Family History Family History  Adopted: Yes  Family history unknown: Yes    Social History Social History   Tobacco Use  . Smoking status: Former Smoker    Packs/day: 0.50    Years: 2.00    Pack years: 1.00  . Smokeless tobacco: Never Used  Substance Use Topics  . Alcohol use: Yes    Alcohol/week: 7.0 standard drinks    Types: 7 Cans of beer per week  . Drug use: Not Currently  Types: Marijuana, Cocaine    Comment: no MJ or cocaine use in about 2 years. previously used cocaine about once/month     Allergies   Patient has no known allergies.   Review of Systems Review of Systems   Physical Exam Triage Vital Signs ED Triage Vitals  Enc Vitals Group     BP 01/02/20 1121 127/81     Pulse Rate 01/02/20 1121 80     Resp 01/02/20 1121 20     Temp 01/02/20 1121 99.4 F (37.4 C)     Temp Source 01/02/20 1121 Oral     SpO2 01/02/20 1121 96 %     Weight --      Height --      Head Circumference --      Peak Flow --      Pain Score 01/02/20 1118 6     Pain Loc --      Pain Edu? --      Excl. in [place]? --    No data found.  Updated Vital Signs BP 127/81 (BP Location: Right Arm)    Pulse 80   Temp 99.4 F (37.4 C) (Oral)   Resp 20   SpO2 96%    Physical Exam Constitutional:      Appearance: He is well-developed.  Cardiovascular:     Rate and Rhythm: Normal rate.  Pulmonary:     Effort: Pulmonary effort is normal.  Musculoskeletal:     Right foot: Normal range of motion. Tenderness present.     Comments: Tenderness to right plantar fascia on palpation; no numbness or tingling, strong pulse; no swelling or redness; cap refill < 2 seconds    Skin:    General: Skin is warm and dry.  Neurological:     Mental Status: He is alert and oriented to person, place, and time.      UC Treatments / Results  Labs (all labs ordered are listed, but only abnormal results are displayed) Labs Reviewed - No data to display  EKG   Radiology No results found.  Procedures Procedures (including critical care time)  Medications Ordered in UC Medications - No data to display  Initial Impression / Assessment and Plan / UC Course  I have reviewed the triage vital signs and the nursing notes.  Pertinent labs & imaging results that were available during my care of the patient were reviewed by me and considered in my medical decision making (see chart for details).     At this time exam is consistent with plantar fasciitis. Shoe insoles recommended, nsaids, ice, stretching/ massage to the foot. Follow up with sports medicine and/or podiatry for continued management. Return precautions provided. Patient verbalized understanding and agreeable to plan.   Final Clinical Impressions(s) / UC Diagnoses   Final diagnoses:  Plantar fasciitis, right     Discharge Instructions     Wear supportive shoes and arch support with your work shoes.  Use of ace wrap to help with compression and support.  Massage under the foot with a tennis ball as discussed.  Meloxicam daily. Don't take additional ibuprofen for aleve. Take with food.  Continue to follow with your PCP and/or sports  medicine for persistent symptoms.    ED Prescriptions    Medication Sig Dispense Auth. Provider   meloxicam (MOBIC) 7.5 MG tablet Take 1 tablet (7.5 mg total) by mouth daily. 20 tablet Zigmund Gottron, NP     PDMP not reviewed this encounter.   Augusto Gamble  B, NP 01/02/20 1836

## 2020-01-05 ENCOUNTER — Encounter: Payer: Self-pay | Admitting: Family Medicine

## 2020-01-06 ENCOUNTER — Ambulatory Visit: Payer: PRIVATE HEALTH INSURANCE | Admitting: Dietician

## 2020-01-07 ENCOUNTER — Other Ambulatory Visit: Payer: Self-pay

## 2020-01-07 ENCOUNTER — Ambulatory Visit (INDEPENDENT_AMBULATORY_CARE_PROVIDER_SITE_OTHER): Payer: PRIVATE HEALTH INSURANCE | Admitting: Family Medicine

## 2020-01-07 VITALS — BP 108/76 | HR 88 | Wt 350.0 lb

## 2020-01-07 DIAGNOSIS — E119 Type 2 diabetes mellitus without complications: Secondary | ICD-10-CM | POA: Diagnosis not present

## 2020-01-07 DIAGNOSIS — M79672 Pain in left foot: Secondary | ICD-10-CM

## 2020-01-07 DIAGNOSIS — I3489 Other nonrheumatic mitral valve disorders: Secondary | ICD-10-CM

## 2020-01-07 DIAGNOSIS — M79671 Pain in right foot: Secondary | ICD-10-CM | POA: Diagnosis not present

## 2020-01-07 DIAGNOSIS — I340 Nonrheumatic mitral (valve) insufficiency: Secondary | ICD-10-CM | POA: Diagnosis not present

## 2020-01-07 LAB — POCT GLYCOSYLATED HEMOGLOBIN (HGB A1C): HbA1c, POC (controlled diabetic range): 6.8 % (ref 0.0–7.0)

## 2020-01-07 MED ORDER — DICLOFENAC SODIUM 1 % EX GEL: 2.0000 g | Freq: Four times a day (QID) | CUTANEOUS | 1 refills | Status: DC

## 2020-01-07 NOTE — Patient Instructions (Addendum)
Today the list of things that we talked about for try to get a feeling better included:  -Checking your A1c, will need to have you schedule a telemedicine visit to go over that if it seems increased enough that we need to talk about more medicines -Giving a list of eye doctors to go get your annual diabetic eye exam -You passed your diabetic foot exam today which is good but I know he still foot pain.  Were going to give you some Voltaren gel to help with the pain and a work note to hopefully get you some light duty. -Our nurse is going to try and help you schedule the MRI that the cardiologist wanted you to do.  We also reminded you that the cardiologist said you should get an echo every 2 years for your mitral valve.

## 2020-01-07 NOTE — Assessment & Plan Note (Signed)
Says he is taking medications regularly, A1c 6.8 which is slight increase from prior 6.5.  No new medicinal interventions at this point, did discuss nutrition with the patient and offered referral to nutrition

## 2020-01-07 NOTE — Assessment & Plan Note (Signed)
Chronic bilateral foot pain, prior imaging shows mild degenerative changes, does not describe radicular nerve pain consistent with diabetic neuropathy.  Does past diabetic foot exam today.  Discussed at length and I believe weight control along with topical NSAIDs would be the main treatment.  Patient did ask for work note stating that he should not work for few weeks "to let everything calm down ".  I discussed that I do not believe that lack of activity would be the most appropriate treatment for him but that I could provide a work note suggesting that he try to find light duty with less standing on his feet for long periods of time.

## 2020-01-07 NOTE — Assessment & Plan Note (Signed)
Reminded patient that he should be getting a echo every 2 years for evaluation

## 2020-01-07 NOTE — Progress Notes (Signed)
    Subjective:  Fred Reyes is a 29 y.o. male who presents to the [facility] today with a chief complaint of bilateral foot pain.   HPI: Patient here primarily for chronic bilateral foot pain, he knows that he has some degenerative issues in his feet and is hoping to get a work note that says he should be able to be home for a week or 2.  There was some prior messaging between him and his primary care physician but he had been evaluated elsewhere so he was asked to come into our clinic for evaluation.  He has had no new injuries or significant changes in weight, he does work in [employer] and stands primarily at his job and has been causing problems for him.  Objective:  Physical Exam: BP 108/76   Pulse 88   Wt (!) 350 lb (158.8 kg)   SpO2 98%   BMI 51.69 kg/m   Gen: NAD, conversing comfortably CV: RRR with 2/6 murmur appreciated Pulm: NWOB, CTAB with no crackles, wheezes, or rhonchi MSK: no edema, cyanosis, or clubbing noted, past diabetic foot exam, feet obese but with no pitting edema or erythema. Skin: warm, dry Neuro: grossly normal, moves all extremities Psych: Normal affect and thought content  Results for orders placed or performed in visit on 01/07/20 (from the past 72 hour(s))  HgB A1c     Status: None   Collection Time: 01/07/20 10:42 AM  Result Value Ref Range   Hemoglobin A1C     HbA1c POC (<> result, manual entry)     HbA1c, POC (prediabetic range)     HbA1c, POC (controlled diabetic range) 6.8 0.0 - 7.0 %     Assessment/Plan:  Myxomatous degeneration of mitral valve Reminded patient that he should be getting a echo every 2 years for evaluation  Type 2 diabetes mellitus without complication, without long-term current use of insulin (HCC) Says he is taking medications regularly, A1c 6.8 which is slight increase from prior 6.5.  No new medicinal interventions at this point, did discuss nutrition with the patient and offered referral to nutrition  Bilateral foot  pain Chronic bilateral foot pain, prior imaging shows mild degenerative changes, does not describe radicular nerve pain consistent with diabetic neuropathy.  Does past diabetic foot exam today.  Discussed at length and I believe weight control along with topical NSAIDs would be the main treatment.  Patient did ask for work note stating that he should not work for few weeks "to let everything calm down ".  I discussed that I do not believe that lack of activity would be the most appropriate treatment for him but that I could provide a work note suggesting that he try to find light duty with less standing on his feet for long periods of time.   Marthenia Rolling, DO FAMILY MEDICINE RESIDENT - PGY3 01/07/2020 6:59 PM

## 2020-01-07 NOTE — Assessment & Plan Note (Signed)
>>  ASSESSMENT AND PLAN FOR BILATERAL FOOT PAIN WRITTEN ON 01/07/2020  6:59 PM BY BLAND, SCOTT, DO  Chronic bilateral foot pain, prior imaging shows mild degenerative changes, does not describe radicular nerve pain consistent with diabetic neuropathy.  Does past diabetic foot exam today.  Discussed at length and I believe weight control along with topical NSAIDs would be the main treatment.  Patient did ask for work note stating that he should not work for few weeks to let everything calm down .  I discussed that I do not believe that lack of activity would be the most appropriate treatment for him but that I could provide a work note suggesting that he try to find light duty with less standing on his feet for long periods of time.

## 2020-01-14 ENCOUNTER — Ambulatory Visit (INDEPENDENT_AMBULATORY_CARE_PROVIDER_SITE_OTHER): Payer: PRIVATE HEALTH INSURANCE | Admitting: Neurology

## 2020-01-14 DIAGNOSIS — G4733 Obstructive sleep apnea (adult) (pediatric): Secondary | ICD-10-CM

## 2020-01-14 DIAGNOSIS — M7989 Other specified soft tissue disorders: Secondary | ICD-10-CM

## 2020-01-14 DIAGNOSIS — Z6841 Body Mass Index (BMI) 40.0 and over, adult: Secondary | ICD-10-CM

## 2020-01-14 DIAGNOSIS — R519 Headache, unspecified: Secondary | ICD-10-CM

## 2020-01-14 DIAGNOSIS — R400 Somnolence: Secondary | ICD-10-CM

## 2020-01-14 DIAGNOSIS — R351 Nocturia: Secondary | ICD-10-CM

## 2020-01-14 DIAGNOSIS — R0683 Snoring: Secondary | ICD-10-CM

## 2020-01-21 ENCOUNTER — Ambulatory Visit (HOSPITAL_COMMUNITY): Admission: RE | Admit: 2020-01-21 | Payer: PRIVATE HEALTH INSURANCE | Source: Ambulatory Visit

## 2020-01-21 NOTE — Procedures (Signed)
Patient Information     First Name: Fred Last Name: Reyes ID: [id number]  Birth Date: 02-Oct-1991 Age: 29 Gender: Male  Referring Provider: [name], MD BMI: 51.9 (W=350 lb, H=5' 9'')  Neck Circ.:  55 '' Epworth:  18/24   Sleep Study Information    Study Date: 01/14/2020 S/H/A Version: 001.001.001.001 / 4.1.1528 / 35  History:    29 year old man with a history of hypertension, myxomatous degeneration of the mitral valve, depression, LVH, LE edema, foot pain, anxiety, asthma, allergies, type 2 diabetes and morbid obesity with a BMI of over 50, who reports snoring and excessive daytime somnolence. Summary & Diagnosis:     Severe OSA Recommendations:      This home sleep test demonstrates severe obstructive sleep apnea with a total AHI of 64.1/hour and O2 nadir of 80%. Treatment with positive airway pressure (in the form of CPAP) is highly recommended. This will require a full night CPAP titration study for proper treatment settings and mask fitting. Please note that untreated obstructive sleep apnea may carry additional perioperative morbidity. Patients with significant obstructive sleep apnea should receive perioperative PAP therapy and the surgeons and particularly the anesthesiologist should be informed of the diagnosis and the severity of the sleep disordered breathing. The patient should be cautioned not to drive, work at heights, or operate dangerous or heavy equipment when tired or sleepy. Review and reiteration of good sleep hygiene measures should be pursued with any patient. Other causes of the patient's symptoms, including circadian rhythm disturbances, an underlying mood disorder, medication effect and/or an underlying medical problem cannot be ruled out based on this test. Clinical correlation is recommended. The patient will be seen in follow-up by Dr. Rexene Alberts at [facility] for discussion of the test results and further management strategies. The referring provider will be notified of the test  results.  I certify that I have reviewed the raw data recording prior to the issuance of this report in accordance with the standards of the American Academy of Sleep Medicine (AASM).  Star Age, MD, PhD [facility]) Diplomat, ABPN (neurology and sleep)                      Sleep Summary  Oxygen Saturation Statistics   Start Study Time: End Study Time: Total Recording Time:  5:53:50 PM 1:18:23 AM   7 h, 24 min  Total Sleep Time % REM of Sleep Time:  3 h, 43 min  3.6    Mean: 96 Minimum: 80 Maximum: 100  Mean of Desaturations Nadirs (%):   93  Oxygen Desaturation. %:   4-9 10-20 >20 Total  Events Number Total    86  5 94.5 5.5  0 0.0  91 100.0  Oxygen Saturation <90 <=88 <85 <80 <70  Duration (minutes): Sleep % 0.3 0.1  0.2 0.0  0.1 0.0 0.0 0.0 0.0 0.0     Respiratory Indices      Total Events REM NREM All Night  pRDI:  181  pAHI:  180 ODI:  91  pAHIc:  31  % CSR: 0.0 N/A N/A N/A N/A N/A N/A N/A N/A 64.4 64.1 32.4 11.0       Pulse Rate Statistics during Sleep (BPM)      Mean: 77 Minimum: 37 Maximum: 122    Indices are calculated using technically valid sleep time of  3 h, 38 min.  pRDI/pAHI are calculated using oxi desaturations ? 3% REM/NREM indices appear only if REM  time >  30 min.                                    Body Position Statistics  Position Supine Prone Right Left Non-Supine  Sleep (min) 51.0 137.5 33.0 1.5 172.0  Sleep % 22.9 61.7 14.8 0.7 77.1  pRDI 108.4 44.9 N/A N/A 49.1  pAHI 108.4 44.4 N/A N/A 48.6  ODI 50.8 22.7 N/A N/A 26.0     Snoring Statistics Snoring Level (dB) >40 >50 >60 >70 >80 >Threshold (45)  Sleep (min) 112.0 31.3 7.4 0.0 0.0 50.2  Sleep % 50.2 14.0 3.3 0.0 0.0 22.5    Mean: 44 dB Sleep Stages Chart                                                                             pAHI=64.1                                                                                               Mild              Moderate                    Severe                                                 5              15                    30

## 2020-01-21 NOTE — Addendum Note (Signed)
Addended by: Huston Foley on: 01/21/2020 08:08 AM   Modules accepted: Orders

## 2020-01-21 NOTE — Progress Notes (Signed)
Patient referred by Dr. Parke Simmers from resident clinic, seen by me on 01/01/20, HST on 01/14/20:  Please call and notify the patient that the recent home sleep test showed severe obstructive sleep apnea and that I recommend treatment for this in the form of CPAP. I will request an overnight sleep study for proper titration and mask fitting. Please explain to patient.  Huston Foley, MD, PhD [facility])

## 2020-01-22 ENCOUNTER — Telehealth: Payer: Self-pay | Admitting: *Deleted

## 2020-01-22 NOTE — Telephone Encounter (Addendum)
Spoke with pt and he had wife in the background. I discussed the results from Dr. Frances Furbish listed below. I let him know he would receive a separate call to schedule the overnight titration study. He verbalized appreciation for the call and is agreeable to proceeding with the titration study.   ----- Message ----- From: Huston Foley, MD Sent: 01/21/2020   8:08 AM EST   Patient referred by Dr. Parke Simmers from resident clinic, seen by me on 01/01/20, HST on 01/14/20:  Please call and notify the patient that the recent home sleep test showed severe obstructive sleep apnea and that I recommend treatment for this in the form of CPAP. I will request an overnight sleep study for proper titration and mask fitting. Please explain to patient.  Huston Foley, MD, PhD [facility])

## 2020-02-09 ENCOUNTER — Other Ambulatory Visit (HOSPITAL_COMMUNITY)
Admission: RE | Admit: 2020-02-09 | Discharge: 2020-02-09 | Disposition: A | Discharge status: Home / Self Care | Payer: PRIVATE HEALTH INSURANCE | Source: Ambulatory Visit | Attending: Neurology | Admitting: Neurology

## 2020-02-09 DIAGNOSIS — Z20822 Contact with and (suspected) exposure to covid-19: Secondary | ICD-10-CM | POA: Diagnosis not present

## 2020-02-09 DIAGNOSIS — Z01812 Encounter for preprocedural laboratory examination: Secondary | ICD-10-CM | POA: Insufficient documentation

## 2020-02-09 LAB — SARS CORONAVIRUS 2 (TAT 6-24 HRS): SARS Coronavirus 2: NEGATIVE

## 2020-02-12 ENCOUNTER — Ambulatory Visit (INDEPENDENT_AMBULATORY_CARE_PROVIDER_SITE_OTHER): Payer: PRIVATE HEALTH INSURANCE | Admitting: Neurology

## 2020-02-12 ENCOUNTER — Other Ambulatory Visit: Payer: Self-pay

## 2020-02-12 DIAGNOSIS — M7989 Other specified soft tissue disorders: Secondary | ICD-10-CM

## 2020-02-12 DIAGNOSIS — R351 Nocturia: Secondary | ICD-10-CM

## 2020-02-12 DIAGNOSIS — R519 Headache, unspecified: Secondary | ICD-10-CM

## 2020-02-12 DIAGNOSIS — R400 Somnolence: Secondary | ICD-10-CM

## 2020-02-12 DIAGNOSIS — G4733 Obstructive sleep apnea (adult) (pediatric): Secondary | ICD-10-CM
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
## 2020-02-23 NOTE — Addendum Note (Signed)
Addended by: Huston Foley on: 02/23/2020 08:26 AM   Modules accepted: Orders

## 2020-02-23 NOTE — Procedures (Signed)
S PATIENT'S NAME:  Fred Reyes, Fred Reyes DOB:      05-22-1991      MR#:    [id number]     DATE OF RECORDING: 02/12/2020 REFERRING M.D.:  [name] MD Study Performed:   CPAP  Titration HISTORY: 29 year old man with a history of hypertension, myxomatous degeneration of the mitral valve, depression, LVH, LE edema, foot pain, anxiety, asthma, allergies, type 2 diabetes and morbid obesity with a BMI of over 50, who presents for a full night titration study to treat his OSA. His home sleep test from 01/14/20 showed severe obstructive sleep apnea with a total AHI of 64.1/hour and O2 nadir of 80%. The patient endorsed the Epworth Sleepiness Scale at 18 points. The patient's weight 351 pounds with a height of 69 (inches), resulting in a BMI of 51.9 kg/m2. The patient's neck circumference measured 20.5 inches.  CURRENT MEDICATIONS: Diovan, Semaglutide, Zofran, Naprosyn, Glucophage, Lipitor  PROCEDURE:  This is a multichannel digital polysomnogram utilizing the SomnoStar 11.2 system.  Electrodes and sensors were applied and monitored per AASM Specifications.   EEG, EOG, Chin and Limb EMG, were sampled at 200 Hz.  ECG, Snore and Nasal Pressure, Thermal Airflow, Respiratory Effort, CPAP Flow and Pressure, Oximetry was sampled at 50 Hz. Digital video and audio were recorded.      The patient was fitted with a medium Vitera FFM, but an F20 may be better; a nasal mask trial showed too much leak and mouth venting. CPAP was initiated at 6 cmH20 with heated humidity per AASM standards and pressure was advanced to 14 cmH20 because of hypopneas, apneas and desaturations. Due to higher pressure requirement, he was switched to BiPAP at a pressure of 15/11 cm and further titrated to 21/16 cm, at which point his AHI was 0/hour and brief supine REM sleep was achieved; O2 nadir of 93%.   Lights Out was at 20:30 and Lights On at 03:56. Total recording time (TRT) was 446.5 minutes, with a total sleep time (TST) of 412.5 minutes. The  patient's sleep latency was 13 minutes. REM latency was 64 minutes.  The sleep efficiency was 92.4 %.    SLEEP ARCHITECTURE: WASO (Wake after sleep onset)  was 20 minutes.  There were 21.5 minutes in Stage N1, 166.5 minutes Stage N2, 95.5 minutes Stage N3 and 129 minutes in Stage REM.  The percentage of Stage N1 was 5.2%, Stage N2 was 40.4%, Stage N3 was 23.2% and Stage R (REM sleep) was 31.3%, which is increased, and in keeping with rebound. The arousals were noted as: 26 were spontaneous, 0 were associated with PLMs, 13 were associated with respiratory events.  RESPIRATORY ANALYSIS:  There was a total of 64 respiratory events: 0 obstructive apneas, 0 central apneas and 0 mixed apneas with a total of 0 apneas and an apnea index (AI) of 0 /hour. There were 64 hypopneas with a hypopnea index of 9.3/hour. The patient also had 0 respiratory event related arousals (RERAs).      The total APNEA/HYPOPNEA INDEX  (AHI) was 9.3 /hour and the total RESPIRATORY DISTURBANCE INDEX was 9.3 /hour  23 events occurred in REM sleep and 41 events in NREM. The REM AHI was 10.7 /hour versus a non-REM AHI of 8.7 /hour.  The patient spent 57 minutes of total sleep time in the supine position and 356 minutes in non-supine. The supine AHI was 17.9, versus a non-supine AHI of 7.9.  OXYGEN SATURATION & C02:  The baseline 02 saturation was 96%, with the lowest  being 88%. Time spent below 89% saturation equaled 0 minutes.  PERIODIC LIMB MOVEMENTS:  The patient had a total of 0 Periodic Limb Movements. The Periodic Limb Movement (PLM) index was 0 and the PLM Arousal index was 0 /hour.  Audio and video analysis did not show any abnormal or unusual movements, behaviors, phonations or vocalizations. The patient took no bathroom breaks. The EKG was in keeping with normal sinus rhythm (NSR).  Post-study, the patient indicated that sleep was better than usual.   DIAGNOSIS:  1. Obstructive Sleep Apnea    PLANS/RECOMMENDATIONS: 1. This study demonstrates eventual resolution of the patient's obstructive sleep apnea with standard BiPAP therapy. He required a high pressure. I will recommend home BiPAP therapy at a pressure of 21/16 cm via medium FFM with heated humidity. The patient should be reminded to be fully compliant with PAP therapy to improve sleep related symptoms and decrease long term cardiovascular risks. The patient should be reminded, that it may take up to 3 months to get fully used to using PAP with all planned sleep. The earlier full compliance is achieved, the better long term compliance tends to be. Please note that untreated obstructive sleep apnea may carry additional perioperative morbidity. Patients with significant obstructive sleep apnea should receive perioperative PAP therapy and the surgeons and particularly the anesthesiologist should be informed of the diagnosis and the severity of the sleep disordered breathing. 2. The patient should be cautioned not to drive, work at heights, or operate dangerous or heavy equipment when tired or sleepy. Review and reiteration of good sleep hygiene measures should be pursued with any patient. 3. The patient will be seen in follow-up in the sleep clinic at [facility] for discussion of the test results, symptom and treatment compliance review, further management strategies, etc. The referring provider will be notified of the test results.  I certify that I have reviewed the entire raw data recording prior to the issuance of this report in accordance with the Standards of Accreditation of the American Academy of Sleep Medicine (AASM)  Huston Foley, MD, PhD Diplomat, American Board of Neurology and Sleep Medicine (Neurology and Sleep Medicine)

## 2020-02-23 NOTE — Progress Notes (Signed)
Patient referred by Dr. Parke Simmers from resident clinic, seen by me on 01/01/20, HST on 01/14/20 showed severe OSA.  Patient had a CPAP titration study on 02/12/20.  Please call and inform patient that I have entered an order for treatment with positive airway pressure (PAP) treatment for obstructive sleep apnea (OSA). He did well during the latest sleep study with BiPAP. We will, therefore, arrange for a machine for home use through a DME (durable medical equipment) company of His choice; and I will see the patient back in follow-up in about 10 weeks. Please also explain to the patient that I will be looking out for compliance data, which can be downloaded from the machine (stored on an SD card, that is inserted in the machine) or via remote access through a modem, that is built into the machine. At the time of the followup appointment we will discuss sleep study results and how it is going with PAP treatment at home. Please advise patient to bring His machine at the time of the first FU visit, even though this is cumbersome. Bringing the machine for every visit after that will likely not be needed, but often helps for the first visit to troubleshoot if needed. Please re-enforce the importance of compliance with treatment and the need for [place] to monitor compliance data - often an insurance requirement and actually good feedback for the patient as far as how they are doing.  Also remind patient, that any interim PAP machine or mask issues should be first addressed with the DME company, as they can often help better with technical and mask fit issues. Please ask if patient has a preference regarding DME company.  Please also make sure, the patient has a follow-up appointment with me in about 10 weeks from the setup date, thanks. May see one of our nurse practitioners if needed for proper timing of the FU appointment.  Please fax or rout report to the referring provider. Thanks,   Huston Foley, MD, PhD [facility])

## 2020-02-24 ENCOUNTER — Telehealth: Payer: Self-pay

## 2020-02-24 NOTE — Telephone Encounter (Signed)
-----   Message from Huston Foley, MD sent at 02/23/2020  8:26 AM EST ----- Patient referred by Dr. Parke Simmers from resident clinic, seen by me on 01/01/20, HST on 01/14/20 showed severe OSA.  Patient had a CPAP titration study on 02/12/20.  Please call and inform patient that I have entered an order for treatment with positive airway pressure (PAP) treatment for obstructive sleep apnea (OSA). He did well during the latest sleep study with BiPAP. We will, therefore, arrange for a machine for home use through a DME (durable medical equipment) company of His choice; and I will see the patient back in follow-up in about 10 weeks. Please also explain to the patient that I will be looking out for compliance data, which can be downloaded from the machine (stored on an SD card, that is inserted in the machine) or via remote access through a modem, that is built into the machine. At the time of the followup appointment we will discuss sleep study results and how it is going with PAP treatment at home. Please advise patient to bring His machine at the time of the first FU visit, even though this is cumbersome. Bringing the machine for every visit after that will likely not be needed, but often helps for the first visit to troubleshoot if needed. Please re-enforce the importance of compliance with treatment and the need for [place] to monitor compliance data - often an insurance requirement and actually good feedback for the patient as far as how they are doing.  Also remind patient, that any interim PAP machine or mask issues should be first addressed with the DME company, as they can often help better with technical and mask fit issues. Please ask if patient has a preference regarding DME company.  Please also make sure, the patient has a follow-up appointment with me in about 10 weeks from the setup date, thanks. May see one of our nurse practitioners if needed for proper timing of the FU appointment.  Please fax or rout report to  the referring provider. Thanks,   Huston Foley, MD, PhD [facility])

## 2020-02-24 NOTE — Telephone Encounter (Signed)
I contacted the pt and we discussed his sleep study result. PT was agreeable to using machine and [employer] as dme. He understands to use machine at least 4 hours every night and initial f/u has been made.  Letter to pt and order to [employer] has been sent.

## 2020-02-27 ENCOUNTER — Encounter: Payer: Self-pay | Admitting: Family Medicine

## 2020-02-27 ENCOUNTER — Other Ambulatory Visit: Payer: Self-pay | Admitting: Family Medicine

## 2020-02-27 DIAGNOSIS — Z598 Other problems related to housing and economic circumstances: Secondary | ICD-10-CM

## 2020-02-27 DIAGNOSIS — Z5989 Other problems related to housing and economic circumstances: Secondary | ICD-10-CM

## 2020-03-02 ENCOUNTER — Ambulatory Visit: Payer: Self-pay | Admitting: Pharmacist

## 2020-03-02 ENCOUNTER — Other Ambulatory Visit: Payer: Self-pay | Admitting: Pharmacist

## 2020-03-02 ENCOUNTER — Encounter: Payer: Self-pay | Admitting: *Deleted

## 2020-03-02 NOTE — Patient Outreach (Signed)
[facility])  [facility] Quality Pharmacy Team    03/02/2020  Broadus Costilla [birth year] [id number]  Reason for referral: Medication Assistance  Referral source: Dr. Mirian Mo Current insurance: Recently lost insurance   PMHx includes but not limited to:   Outreach:  Successful telephone call with patient.  HIPAA identifiers verified.    Patient reports he recently lost his insurance and is having trouble affording some of his medications.    He has not been checking BP or BG as he does not have BP machine or BG meter at home.    He currently uses Psychologist, forensic.      He has only been taking metformin and valsartan.   He has bottle of atorvastatin but has not been taking it.    He has not taken Rybelsus for several months.  Per review of [facility] $4 list:  -Metformin is $4/30 DS or $9 / 90DS  -Valsartan is not on the list.  Possible substitution to losartan $9/30 DS -Atorvastatin $15 / 30 DS or $38 / 90 DS  We discussed PAP for Rybelsus through [employer].  If provider wishes for him to resume, we can assist with paperwork.  Patient will call to make appt with PCP regarding medication mgmt and need for BP machine and glucometer.   Plan: Will route note to PCP re: adherence.  Will f/u in 2-3 weeks.   Haynes Hoehn, PharmD, [facility] Clinical Pharmacist [facility] [employer] [phone]

## 2020-03-05 ENCOUNTER — Other Ambulatory Visit: Payer: Self-pay

## 2020-03-05 ENCOUNTER — Ambulatory Visit: Payer: PRIVATE HEALTH INSURANCE

## 2020-03-05 ENCOUNTER — Ambulatory Visit: Payer: Self-pay

## 2020-03-05 NOTE — Chronic Care Management (AMB) (Signed)
  Care Management   Outreach Note  03/05/2020 Name: Fred Reyes MRN: [id number] DOB: 08-05-1991  Referred by: Mirian Mo, MD Reason for referral : Care Coordination (Care Management RNCM GLucometer BP Machine)   An unsuccessful telephone outreach was attempted today. The patient was referred to the case management team for assistance with care management and care coordination.   Follow Up Plan: A HIPPA compliant phone message was left for the patient providing contact information and requesting a return call.  The care management team will reach out to the patient again over the next 5-7 days.   Juanell Fairly RN, BSN, [facility] Care Management Coordinator [facility] Phone: [phone] Fax: [phone]

## 2020-03-05 NOTE — Chronic Care Management (AMB) (Signed)
Care Management   Initial Visit Note  03/05/2020 Name: Fred Reyes MRN: [id number] DOB: 1991/11/08  Subjective:   Objective:  Assessment: Fred Reyes is a 29 y.o. year old male who sees Fred Mo, MD for primary care. The care management team was consulted for assistance with care management and care coordination needs related to Disease Management Educational Needs and Care Coordination. For HTN  DM and OSA   Review of patient status, including review of consultants reports, relevant laboratory and other test results, and collaboration with appropriate care team members and the patient's provider was performed as part of comprehensive patient evaluation and provision of care management services.    SDOH (Social Determinants of Health) assessments performed: No See Care Plan activities for detailed interventions related to [facility])     Outpatient Encounter Medications as of 03/05/2020  Medication Sig  . atorvastatin (LIPITOR) 40 MG tablet Take 1 tablet (40 mg total) by mouth daily.  . diclofenac Sodium (VOLTAREN) 1 % GEL Apply 2 g topically 4 (four) times daily.  . meloxicam (MOBIC) 7.5 MG tablet Take 1 tablet (7.5 mg total) by mouth daily.  . metFORMIN (GLUCOPHAGE) 500 MG tablet Take 1 tablet (500 mg total) by mouth 2 (two) times daily with a meal.  . ondansetron (ZOFRAN) 4 MG tablet Take 1 tablet (4 mg total) by mouth every 8 (eight) hours as needed for nausea or vomiting.  . Semaglutide 3 MG TABS Take 3 mg by mouth daily. Take one tablet on empty stomach first thing in the AM  . valsartan (DIOVAN) 80 MG tablet Take 1 tablet (80 mg total) by mouth at bedtime.   No facility-administered encounter medications on file as of 03/05/2020.    Goals Addressed            This Visit's Progress   . I am out of work and have no insurance (pt-stated)       CARE PLAN ENTRY (see longtitudinal plan of care for additional care plan information)  . Need for equipment for chronic  conditions Current Barriers:  . Corporate treasurer.   Nurse Case Manager Clinical Goal(s):  [facility] Over the next 30 days, patient will verbalize understanding of plan for obtaining equipment for chronic conditions HTN DM type II and OSA  Interventions:  . Evaluation of current treatment plan related to  patient's adherence to plan as established by provider. . Provided education to patient re: HTN and DM verbally and mailing information Living well with Diabetes. A matter of choice blood pressure control  . Discussed plans with patient for ongoing care management follow up and provided patient with direct contact information for care management team . Advised patient, providing education and rationale, to monitor blood pressure daily and record the office for findings outside established parameters. . Advised patient, providing education and rationale, to check cbg daily and record, calling the office for findings outside established parameters.   . Patient also states that he has OSA and needs a CPAP I advised him that [employer] has a Pension scheme manager and will print a copy of the paper he will need.  He can come by the office to pick it up make copies of the necessary paperwork and mail it or fax it it in.  Patient Self Care Activities:  . Patient verbalizes understanding of plan  . Self administers medications as prescribed . Attends all scheduled provider appointments . Calls pharmacy for medication refills . Performs ADL's independently . Performs IADL's independently . Calls provider  office for new concerns or questions . Unable to independently self navigation for financial assistance  Initial goal documentation         Follow up plan:  The care management team will reach out to the patient again over the next 10 days.  The patient has been provided with contact information for the care management team and has been advised to call with any health related questions or concerns.    Fred Reyes was given information about Care Management services today including:  1. Care Management services include personalized support from designated clinical staff supervised by a physician, including individualized plan of care and coordination with other care providers 2. 24/7 contact phone numbers for assistance for urgent and routine care needs. 3. The patient may stop Care Management services at any time (effective at the end of the month) by phone call to the office staff.  Patient agreed to services and verbal consent obtained.  Fred Arms RN, BSN, [facility] Care Management Coordinator [place] Phone: [phone] Fax: [phone]

## 2020-03-05 NOTE — Patient Instructions (Signed)
Visit Information    Goals Addressed            This Visit's Progress   . I am out of work and have no insurance (pt-stated)       CARE PLAN ENTRY (see longtitudinal plan of care for additional care plan information)  . Need for equipment for chronic conditions Current Barriers:  . Corporate treasurer.   Nurse Case Manager Clinical Goal(s):  Marland Kitchen Over the next 30 days, patient will verbalize understanding of plan for obtaining equipment for chronic conditions HTN DM type II and OSA  Interventions:  . Evaluation of current treatment plan related to  patient's adherence to plan as established by provider. . Provided education to patient re: HTN and DM verbally and mailing information Living well with Diabetes. A matter of choice blood pressure control  . Discussed plans with patient for ongoing care management follow up and provided patient with direct contact information for care management team . Advised patient, providing education and rationale, to monitor blood pressure daily and record the office for findings outside established parameters. . Advised patient, providing education and rationale, to check cbg daily and record, calling the office for findings outside established parameters.   . Patient also states that he has OSA and needs a CPAP I advised him that [employer] has a Pension scheme manager and will print a copy of the paper he will need.  He can come by the office to pick it up make copies of the necessary paperwork and mail it or fax it it in.  Patient Self Care Activities:  . Patient verbalizes understanding of plan  . Self administers medications as prescribed . Attends all scheduled provider appointments . Calls pharmacy for medication refills . Performs ADL's independently . Performs IADL's independently . Calls provider office for new concerns or questions . Unable to independently self navigation for financial assistance  Initial goal documentation        Mr.  Doro was given information about Care Management services today including:  1. Care Management services include personalized support from designated clinical staff supervised by his physician, including individualized plan of care and coordination with other care providers 2. 24/7 contact phone numbers for assistance for urgent and routine care needs. 3. The patient may stop CCM services at any time (effective at the end of the month) by phone call to the office staff.  Patient agreed to services and verbal consent obtained.   The patient verbalized understanding of instructions provided today and declined a print copy of patient instruction materials.   The care management team will reach out to the patient again over the next 10 days.  The patient has been provided with contact information for the care management team and has been advised to call with any health related questions or concerns.   Juanell Fairly RN, BSN, [facility] Care Management Coordinator [facility] Phone: [phone] Fax: [phone]

## 2020-03-08 ENCOUNTER — Ambulatory Visit: Payer: PRIVATE HEALTH INSURANCE

## 2020-03-08 ENCOUNTER — Other Ambulatory Visit: Payer: Self-pay

## 2020-03-08 NOTE — Chronic Care Management (AMB) (Signed)
  Care Management   Outreach Note  03/08/2020 Name: Fred Reyes MRN: [id number] DOB: 1991-01-22  Referred by: Mirian Mo, MD Reason for referral : Care Coordination (Care Management RNCM F/U  Financial papers)   An unsuccessful telephone outreach was attempted today. The patient was referred to the case management team for assistance with care management and care coordination.   Follow Up Plan: A HIPPA compliant phone message was left for the patient providing contact information and requesting a return call.  The care management team will reach out to the patient again over the next 5-7 days.   Juanell Fairly RN, BSN, [facility] Care Management Coordinator [facility] Phone: [phone] Fax: [phone]

## 2020-03-10 ENCOUNTER — Other Ambulatory Visit: Payer: Self-pay

## 2020-03-10 ENCOUNTER — Ambulatory Visit: Payer: PRIVATE HEALTH INSURANCE

## 2020-03-10 NOTE — Chronic Care Management (AMB) (Signed)
  Care Management   Outreach Note  03/10/2020 Name: Fred Reyes MRN: [id number] DOB: [birth year]  Referred by: Mirian Mo, MD Reason for referral : Care Coordination (Care Management RNCM Paperwork F/U)   A second unsuccessful telephone outreach was attempted today. The patient was referred to the case management team for assistance with care management and care coordination.   Follow Up Plan: A HIPPA compliant phone message was left for the patient providing contact information and requesting a return call.  The care management team will reach out to the patient again over the next 7 days.   Juanell Fairly RN, BSN, [facility] Care Management Coordinator [facility] Phone: [phone] Fax: [phone]

## 2020-03-10 NOTE — Patient Instructions (Signed)
Visit Information  Goals Addressed            This Visit's Progress   . I am out of work and have no insurance (pt-stated)       CARE PLAN ENTRY (see longtitudinal plan of care for additional care plan information)  . Need for equipment for chronic conditions Current Barriers:  . Corporate treasurer.   Nurse Case Manager Clinical Goal(s):  Marland Kitchen Over the next 30 days, patient will verbalize understanding of plan for obtaining equipment for chronic conditions HTN DM type II and OSA  Interventions:  . Evaluation of current treatment plan related to  patient's adherence to plan as established by provider. . Provided education to patient re: HTN and DM verbally and mailing information Living well with Diabetes. A matter of choice blood pressure control  . Discussed plans with patient for ongoing care management follow up and provided patient with direct contact information for care management team . Advised patient, providing education and rationale, to monitor blood pressure daily and record the office for findings outside established parameters. . Advised patient, providing education and rationale, to check cbg daily and record, calling the office for findings outside established parameters.   . Patient also states that he has OSA and needs a CPAP I advised him that [employer] has a Pension scheme manager and will print a copy of the paper he will need.  He can come by the office to pick it up make copies of the necessary paperwork and mail it or fax it it in. . 03/10/20 . Called and left the patient a message to let him know that the forms for [employer] and other financial programs are up at the front desk in the office. . Asked him to call me and let me know his condition.  Patient Self Care Activities:  . Patient verbalizes understanding of plan  . Self administers medications as prescribed . Attends all scheduled provider appointments . Calls pharmacy for medication refills . Performs ADL's  independently . Performs IADL's independently . Calls provider office for new concerns or questions . Unable to independently self navigation for financial assistance  Please see past updates related to this goal by clicking on the "Past Updates" button in the selected goal         Mr. Cone was given information about Care Management services today including:  1. Care Management services include personalized support from designated clinical staff supervised by his physician, including individualized plan of care and coordination with other care providers 2. 24/7 contact phone numbers for assistance for urgent and routine care needs. 3. The patient may stop CCM services at any time (effective at the end of the month) by phone call to the office staff.  Patient agreed to services and verbal consent obtained.   The patient verbalized understanding of instructions provided today and declined a print copy of patient instruction materials.   The care management team will reach out to the patient again over the next 7 days.  The patient has been provided with contact information for the care management team and has been advised to call with any health related questions or concerns.   Juanell Fairly RN, BSN, [facility] Care Management Coordinator [facility] Phone: [phone] Fax: [phone]

## 2020-03-16 ENCOUNTER — Telehealth: Payer: PRIVATE HEALTH INSURANCE

## 2020-03-18 ENCOUNTER — Other Ambulatory Visit: Payer: Self-pay

## 2020-03-18 ENCOUNTER — Ambulatory Visit: Payer: PRIVATE HEALTH INSURANCE

## 2020-03-18 NOTE — Chronic Care Management (AMB) (Signed)
  Care Management   Outreach Note  03/18/2020 Name: Fred Reyes MRN: [id number] DOB: September 26, 1991  Referred by: Mirian Mo, MD Reason for referral : Care Coordination (Care Management RNCM 7 day F/u 3rd attempt)   Third unsuccessful telephone outreach was attempted today. The patient was referred to the case management team for assistance with care management and care coordination. The patient's primary care provider has been notified of our unsuccessful attempts to make or maintain contact with the patient. The care management team is pleased to engage with this patient at any time in the future should he/she be interested in assistance from the care management team.   Follow Up Plan: The care management team is available to follow up with the patient after provider conversation with the patient regarding recommendation for care management engagement and subsequent re-referral to the care management team.   Juanell Fairly RN, BSN, [facility] Care Management Coordinator [facility] Phone: [phone] Fax: [phone]

## 2020-03-22 ENCOUNTER — Ambulatory Visit: Payer: Self-pay | Admitting: Pharmacist

## 2020-03-26 ENCOUNTER — Telehealth: Payer: Self-pay | Admitting: *Deleted

## 2020-03-26 ENCOUNTER — Other Ambulatory Visit: Payer: Self-pay

## 2020-03-26 ENCOUNTER — Telehealth (INDEPENDENT_AMBULATORY_CARE_PROVIDER_SITE_OTHER): Payer: PRIVATE HEALTH INSURANCE | Admitting: Family Medicine

## 2020-03-26 DIAGNOSIS — G4733 Obstructive sleep apnea (adult) (pediatric): Secondary | ICD-10-CM | POA: Diagnosis not present

## 2020-03-26 DIAGNOSIS — E119 Type 2 diabetes mellitus without complications: Secondary | ICD-10-CM | POA: Diagnosis not present

## 2020-03-26 NOTE — Telephone Encounter (Signed)
Tried to contact pt to go over questions prior to visit and he did not answer and VM was full.  If he calls back please call [name] on [address], Dr. [name] will try again to see if he gets an answer.Fred Reyes, CMA

## 2020-03-26 NOTE — Progress Notes (Signed)
[facility] Telemedicine Visit  Patient consented to have virtual visit. Method of visit: Video  Encounter participants: Patient: Fred Reyes - located at [employer] Provider: Mirian Mo - located at [facility] Others (if applicable): none  Chief Complaint: Diabetes follow up  HPI:  On answering the phone, Mr. Martel stated that he was hoping to have a diabetes follow-up over the phone.   Diabetes He reports that he currently takes Metformin 500 mg once in the morning and once in the evening.  He reports that he misses 2-3 doses every week. He does not take Ozempic (semaglutide).  He reports that he believes he never started this medication.  OSA He has previously been diagnosed with obstructive sleep apnea via a sleep study.  He was recently given a bill for a CPAP machine which was very expensive.  He wants to know if we have any resources at the clinic that would help him make this more affordable.  ROS: per HPI  Pertinent PMHx: Diabetes, OSA, morbid obesity.  Exam:  General: Comfortable, well-appearing.  Walking around [place] during our conversation.  No issue with shortness of breath during his shopping. Respiratory: Breathing comfortably on room air.  No issue completing long sentences without effort.  Assessment/Plan:  Type 2 diabetes mellitus without complication, without long-term current use of insulin (HCC) His feetHe was encouraged to make her diabetes appointment in person. -Increase Metformin to 1000 mg twice daily -Follow-up in clinic in 3 months for next diabetes visit and lab work -We will plan on starting semaglutide at next visit  Obstructive sleep apnea He has previously been diagnosed with severe obstructive sleep apnea.  The physician who conducted the study was ordered a CPAP but it seems to be prohibitively expensive. -Discussed with Ree Shay, will send information about the orange card -Mr. Norment was encouraged to reach out  to his sleep medicine doctor for financial resources    Time spent during visit with patient: 15 minutes

## 2020-03-26 NOTE — Assessment & Plan Note (Signed)
He has previously been diagnosed with severe obstructive sleep apnea.  The physician who conducted the study was ordered a CPAP but it seems to be prohibitively expensive. -Discussed with Ree Shay, will send information about the orange card -Mr. Fred Reyes was encouraged to reach out to his sleep medicine doctor for financial resources

## 2020-03-26 NOTE — Assessment & Plan Note (Signed)
His feetHe was encouraged to make her diabetes appointment in person. -Increase Metformin to 1000 mg twice daily -Follow-up in clinic in 3 months for next diabetes visit and lab work -We will plan on starting semaglutide at next visit

## 2020-03-30 ENCOUNTER — Ambulatory Visit: Payer: Self-pay | Admitting: Pharmacist

## 2020-03-30 ENCOUNTER — Telehealth: Payer: Self-pay

## 2020-03-30 NOTE — Patient Outreach (Addendum)
[facility])  [facility] Quality Pharmacy Team    03/30/2020  Fred Reyes 11/29/1991 [id number]  Reason for referral: Medication Assistance  Referral source: Dr. Mirian Mo Current insurance: Recently lost insurance   PMHx includes but not limited to: HTN, HLD, T2DM, OSA, obesity  Per review of chart, patient had virtual o/v with PCP on 4/9. Patient reported he did not remember ever discussing semaglutide but is taking his metformin 500mg  twice daily and missing 2-3 doses per week. Dr. increased his dose of metformin to 1000mg  twice daily with follow up planned in 3 months. Plan to start semaglutide at next visit.   Outreach:  Unsuccessful telephone call to patient to assess adherence, confirm medication change with metformin and review o/v plan from PCP. Patient's voicemail box is full, unable to leave message.      In the future, if semalgutide PO (Rybelsus) cost is a concern, there is a PAP through [name].  Patient can also switch from valsartan to losartan to receive at $9/30DS at [facility]. \  Plan: Will route note to PCP re: Rybelsus PAP and losartan consideration.  Will f/u again with patient later this week.   [employer], PharmD Candidate  Agree with above.   [facility], PharmD, [facility] Clinical Pharmacist [facility] [phone]

## 2020-04-01 ENCOUNTER — Ambulatory Visit: Payer: Self-pay | Admitting: Pharmacist

## 2020-04-01 ENCOUNTER — Other Ambulatory Visit: Payer: Self-pay | Admitting: Pharmacist

## 2020-04-01 NOTE — Patient Outreach (Signed)
[facility])  [facility] Quality Pharmacy 04/01/2020  Haadi Santellan [birth year] [id number]  Reason for call: f/u on medication changes from 4/9 o/v and medication adherence  Successful call with patient.  He reports he has not started new dose of metformin yet.  We reviewed updated dose and possible side effects. Patient aware to pick up new dose at pharmacy.  He also has not started back on statin and we reviewed importance of compliance with atorvastatin.  Patient voiced understanding.   MD contacted earlier this week re: cost of Rybelsus and that [facility] can assist with applying for patient assistance program if patient started on this in the future.   Plan: Will close [facility] pharmacy case at this time.  Am happy to assist in the future if needed.   [facility] Hoehn, PharmD, [facility] Clinical Pharmacist [facility] [employer] [phone]

## 2020-05-10 ENCOUNTER — Telehealth: Payer: Self-pay

## 2020-05-10 NOTE — Telephone Encounter (Signed)
Spoke to pt, he states he is unable to afford CPAP, no ins Canceled appt for 05/10/20 Sent message to [employer] for pt assistants

## 2020-05-11 ENCOUNTER — Ambulatory Visit: Payer: Self-pay | Admitting: Adult Health

## 2020-05-16 ENCOUNTER — Telehealth: Payer: PRIVATE HEALTH INSURANCE

## 2020-05-27 ENCOUNTER — Ambulatory Visit: Payer: PRIVATE HEALTH INSURANCE | Admitting: Family Medicine

## 2020-05-29 ENCOUNTER — Inpatient Hospital Stay
Admission: RE | Admit: 2020-05-29 | Discharge: 2020-05-29 | Disposition: A | Discharge status: Home / Self Care | Payer: PRIVATE HEALTH INSURANCE | Source: Ambulatory Visit

## 2020-06-15 ENCOUNTER — Ambulatory Visit: Payer: PRIVATE HEALTH INSURANCE | Admitting: Family Medicine

## 2020-06-15 NOTE — Progress Notes (Deleted)
    SUBJECTIVE:   CHIEF COMPLAINT / HPI:   Diabetes -semaglutide -metformin a1c Foot -nutrition?  vaccine -pneumo -tetanus  HM -hep c  PERTINENT  PMH / PSH: ***  OBJECTIVE:   There were no vitals taken for this visit.  ***  ASSESSMENT/PLAN:   No problem-specific Assessment & Plan notes found for this encounter.     Mirian Mo, MD [facility]

## 2020-08-02 ENCOUNTER — Ambulatory Visit: Payer: PRIVATE HEALTH INSURANCE | Admitting: Family Medicine

## 2020-08-07 IMAGING — CR RIGHT FOOT - 2 VIEW
2 series · 2 of 2 positions shown · non-contrast
Comparison: None.

CLINICAL DATA: Lateral foot pain and swelling.

EXAM:
RIGHT FOOT - 2 VIEW

[x foot ap right (1 of 2)]
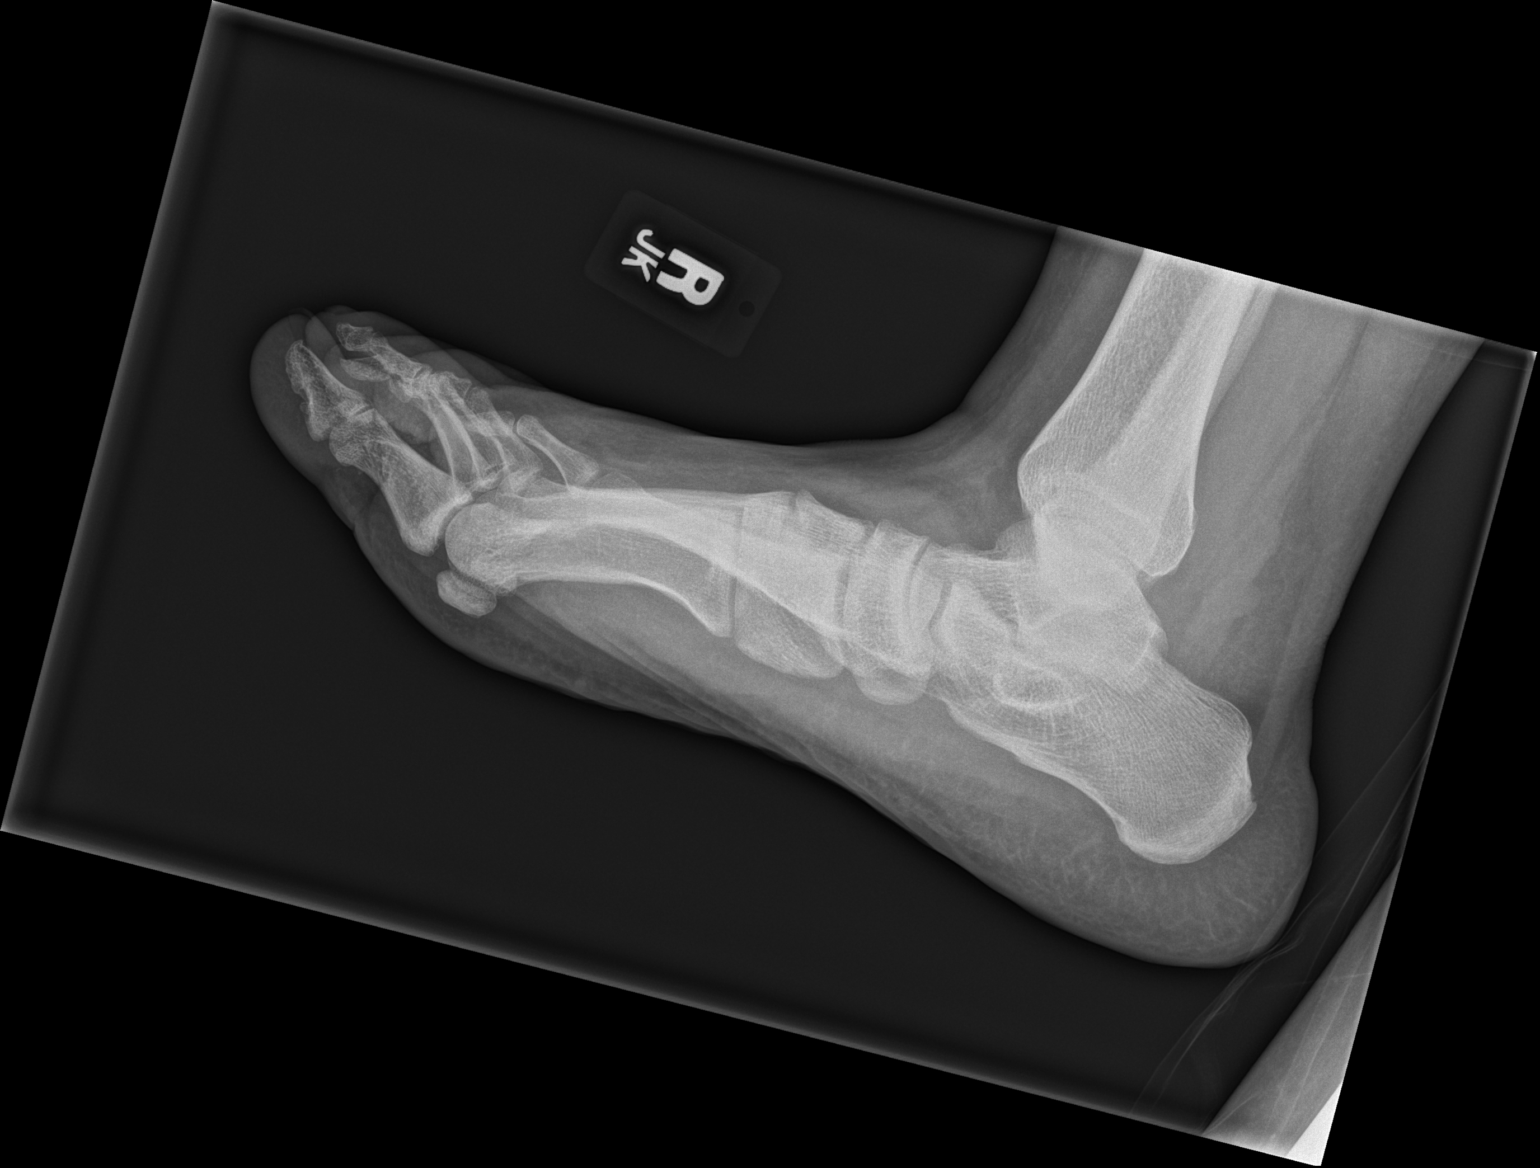

[x foot ap right (2 of 2)]
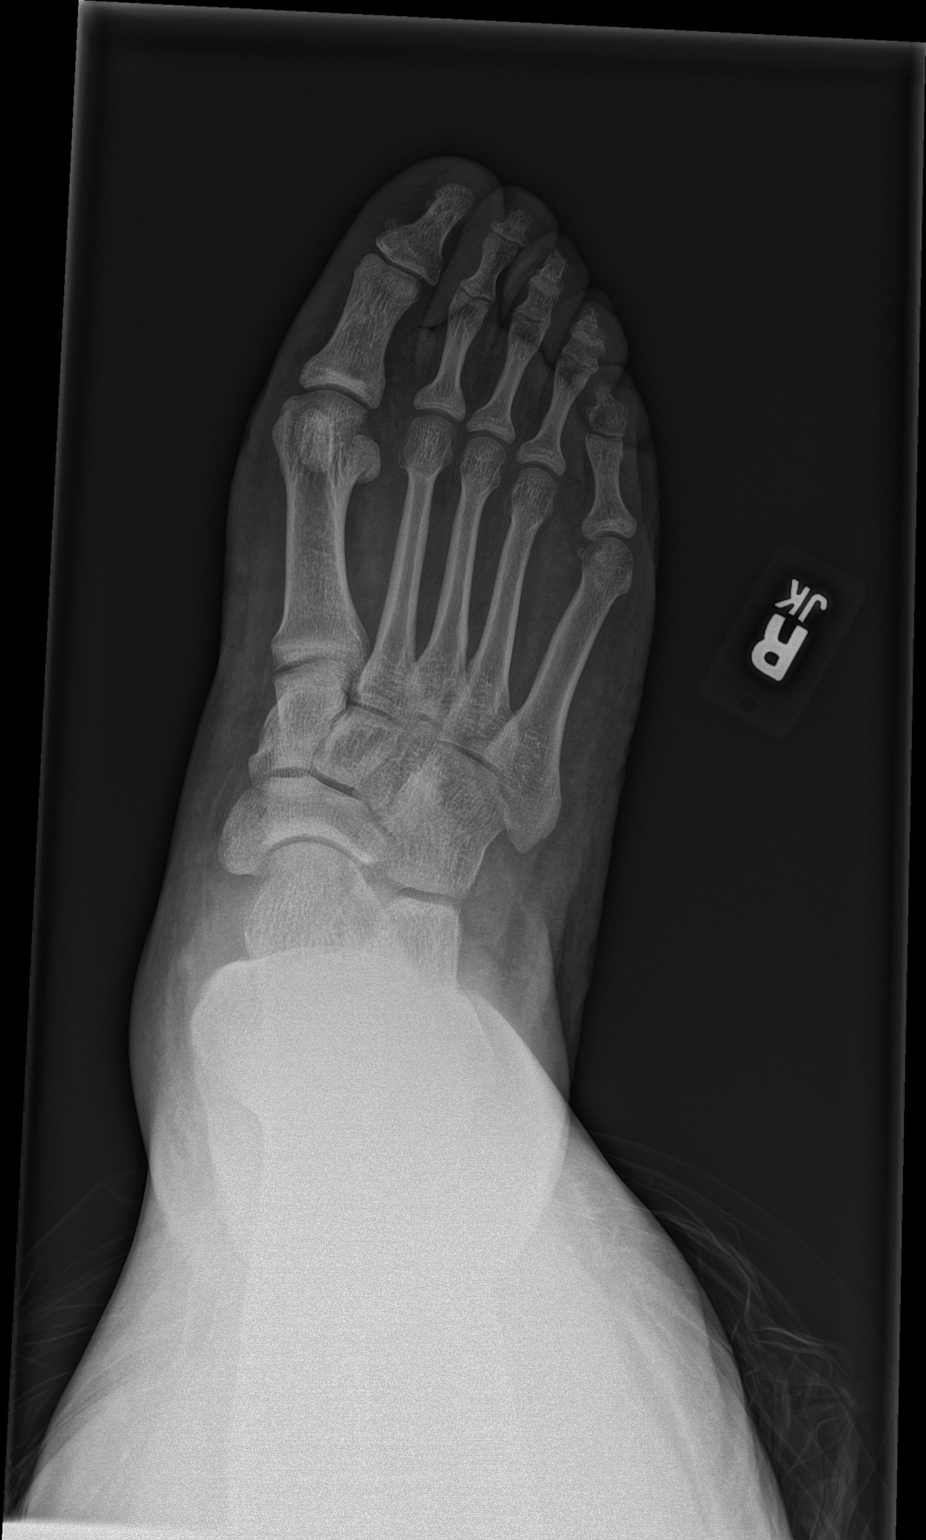

[2 of 2 positions shown; findings below may reference images not displayed]

FINDINGS: Normal anatomic alignment. No evidence for acute fracture or
dislocation. Mild midfoot degenerative changes. Soft tissues are
unremarkable. First MTP joint degenerative changes.
IMPRESSION: No acute osseous abnormality.

## 2020-08-13 ENCOUNTER — Ambulatory Visit (INDEPENDENT_AMBULATORY_CARE_PROVIDER_SITE_OTHER): Payer: Self-pay | Admitting: Family Medicine

## 2020-08-13 ENCOUNTER — Encounter: Payer: Self-pay | Admitting: Family Medicine

## 2020-08-13 ENCOUNTER — Other Ambulatory Visit: Payer: Self-pay

## 2020-08-13 VITALS — BP 110/84 | HR 95 | Ht 69.0 in | Wt 356.5 lb

## 2020-08-13 DIAGNOSIS — I10 Essential (primary) hypertension: Secondary | ICD-10-CM

## 2020-08-13 DIAGNOSIS — R2241 Localized swelling, mass and lump, right lower limb: Secondary | ICD-10-CM

## 2020-08-13 DIAGNOSIS — G4733 Obstructive sleep apnea (adult) (pediatric): Secondary | ICD-10-CM

## 2020-08-13 DIAGNOSIS — E785 Hyperlipidemia, unspecified: Secondary | ICD-10-CM

## 2020-08-13 DIAGNOSIS — E1169 Type 2 diabetes mellitus with other specified complication: Secondary | ICD-10-CM

## 2020-08-13 DIAGNOSIS — M793 Panniculitis, unspecified: Secondary | ICD-10-CM | POA: Insufficient documentation

## 2020-08-13 DIAGNOSIS — E119 Type 2 diabetes mellitus without complications: Secondary | ICD-10-CM

## 2020-08-13 LAB — POCT GLYCOSYLATED HEMOGLOBIN (HGB A1C): HbA1c, POC (controlled diabetic range): 12.2 % — AB (ref 0.0–7.0)

## 2020-08-13 MED ORDER — METFORMIN HCL ER 500 MG PO TB24: 500.0000 mg | ORAL_TABLET | Freq: Two times a day (BID) | ORAL | 3 refills | Status: DC

## 2020-08-13 MED ORDER — OZEMPIC (0.25 OR 0.5 MG/DOSE) 2 MG/1.5ML ~~LOC~~ SOPN: 0.2500 mg | PEN_INJECTOR | SUBCUTANEOUS | 0 refills | Status: DC

## 2020-08-13 NOTE — Assessment & Plan Note (Signed)
>>  ASSESSMENT AND PLAN FOR LOCALIZED SWELLING OF RIGHT LOWER EXTREMITY WRITTEN ON 08/13/2020 12:08 PM BY DEMPSEY COY, MD  Well score 0.  DVT very unlikely.  He was encouraged to use compression socks to help reduce swelling.

## 2020-08-13 NOTE — Assessment & Plan Note (Signed)
Well-controlled with losartan.  Continue losartan. -Follow-up BMP

## 2020-08-13 NOTE — Progress Notes (Signed)
    SUBJECTIVE:   CHIEF COMPLAINT / HPI:   Diabetes He has not been taking his Metformin.  He believes that this is been the cause of headaches.  He has not been taking any diabetes medication.  He does not check daily blood sugars.  Obstructive sleep apnea Need sleep study verified that he does have obstructive sleep apnea but he cannot afford a CPAP.  Would like to know what financial resources are available for him.  Lower extremity swelling Ongoing for at least 1 month.  Swelling in both lower extremities.  Swelling on the right leg seems to be more significant than the left.  No history of cancer, blood clots, being bedridden.  PERTINENT  PMH / PSH: Obesity, diabetes  OBJECTIVE:   BP 110/84   Pulse 95   Ht 5\' 9"  (1.753 m)   Wt (!) 356 lb 8 oz (161.7 kg)   SpO2 97%   BMI 52.65 kg/m    General: Resting comfortably in the chair in the exam room. Respiratory: Breathing comfortably on room air Diabetic Foot Exam - Simple   Simple Foot Form Diabetic Foot exam was performed with the following findings: Yes 08/13/2020 12:07 PM  Visual Inspection No deformities, no ulcerations, no other skin breakdown bilaterally: Yes Sensation Testing Intact to touch and monofilament testing bilaterally: Yes Pulse Check Posterior Tibialis and Dorsalis pulse intact bilaterally: Yes Comments      ASSESSMENT/PLAN:   Hypertension Well-controlled with losartan.  Continue losartan. -Follow-up BMP  Hyperlipidemia associated with type 2 diabetes mellitus (HCC) Not taking his atorvastatin.  He was encouraged start taking his atorvastatin. -Follow-up lipid panel  Localized swelling of right lower extremity Well score 0.  DVT very unlikely.  He was encouraged to use compression socks to help reduce swelling.  Obstructive sleep apnea He was encouraged to apply to the orange card for additional financial resources.     08/15/2020, MD [facility]

## 2020-08-13 NOTE — Progress Notes (Signed)
Asked by Dr. [name] to assist with initiation of Ozempic (semaglutide).  Patient educated on purpose, proper use and potential adverse effects of GI intolerance.  Following instruction patient verbalized understanding of treatment plan. Patient was able to demonstrate and administered first dose in office.   Instruction provided with: Cordella Register, PharmD Candidate.

## 2020-08-13 NOTE — Patient Instructions (Signed)
Insurance: Please pick up a packet for the orange card on your way out today and fill it out.  This can provide some additional financial resources for you.  Leg swelling: Do not think that this is anything dangerous.  I recommend that you continue using compression socks.  CPAP: I do not think that we can provide any additional options at this time and see if felt the orange card packet.  Diabetes: Please start taking the extended release version of Metformin once in the morning and once in the evening.  I think that this will help with your headaches.  Please also start taking this weekly injection to help with your diabetes control.  Follow-up with me in 3 months.

## 2020-08-13 NOTE — Assessment & Plan Note (Signed)
Well score 0.  DVT very unlikely.  He was encouraged to use compression socks to help reduce swelling.

## 2020-08-13 NOTE — Assessment & Plan Note (Signed)
Not taking his atorvastatin.  He was encouraged start taking his atorvastatin. -Follow-up lipid panel

## 2020-08-13 NOTE — Assessment & Plan Note (Signed)
He was encouraged to apply to the orange card for additional financial resources.

## 2020-08-14 LAB — LIPID PANEL
Chol/HDL Ratio: 12 ratio — ABNORMAL HIGH (ref 0.0–5.0)
Cholesterol, Total: 263 mg/dL — ABNORMAL HIGH (ref 100–199)
HDL: 22 mg/dL — ABNORMAL LOW (ref >39)
Triglycerides: 1264 mg/dL (ref 0–149)

## 2020-08-18 ENCOUNTER — Telehealth: Payer: Self-pay

## 2020-08-18 ENCOUNTER — Other Ambulatory Visit: Payer: Self-pay | Admitting: Family Medicine

## 2020-08-18 DIAGNOSIS — I10 Essential (primary) hypertension: Secondary | ICD-10-CM

## 2020-08-18 DIAGNOSIS — E1169 Type 2 diabetes mellitus with other specified complication: Secondary | ICD-10-CM

## 2020-08-18 NOTE — Telephone Encounter (Signed)
Patient calls nurse line returning PCP phone call. I attempted to call him back to discuss results and need for fasting labs. LVM.

## 2020-09-12 ENCOUNTER — Telehealth: Payer: PRIVATE HEALTH INSURANCE | Admitting: Family

## 2020-09-12 DIAGNOSIS — M7989 Other specified soft tissue disorders: Secondary | ICD-10-CM

## 2020-09-12 NOTE — Progress Notes (Signed)
Based on what you shared with me, I feel your condition warrants further evaluation and I recommend that you be seen for a face to face office visit.  Given you are having leg swelling that is tender, you need to be seen face to face to rule out more serious conditions.    NOTE: If you entered your credit card information for this eVisit, you will not be charged. You may see a "hold" on your card for the $35 but that hold will drop off and you will not have a charge processed.   If you are having a true medical emergency please call 911.      For an urgent face to face visit, [place] has five urgent care centers for your convenience:      NEW:  [facility] at [facility] Directions [phone] [address] [place], [place] [address] . 10 am - 6pm Monday - Friday    [facility] Urgent Care Center [facility]) Get Driving Directions [phone] [address] [place], [place] [address] . 10 am to 8 pm Monday-Friday . 12 pm to 8 pm [facility] Urgent Care at [facility] Get Driving Directions [phone] [address] [place], [place] [address] . 8 am to 8 pm Monday-Friday . 9 am to 6 pm Saturday . 11 am to 6 pm Sunday     [facility] Urgent Care at [facility] Get Driving Directions  [phone] [address] [place], [place] [address] . 8 am to 8 pm Monday-Friday . 8 am to 4 pm [facility] Urgent Care at [facility] Directions [phone] [address] [place], [place] [address] . 12 pm to 6 pm Monday-Friday      Your e-visit answers were reviewed by a board certified advanced clinical practitioner to complete your personal care plan.  Thank you for using e-Visits.

## 2020-10-15 ENCOUNTER — Ambulatory Visit: Payer: PRIVATE HEALTH INSURANCE

## 2020-10-27 ENCOUNTER — Encounter: Payer: PRIVATE HEALTH INSURANCE | Admitting: Family Medicine

## 2020-12-09 ENCOUNTER — Other Ambulatory Visit: Payer: Self-pay | Admitting: Family Medicine

## 2020-12-13 MED ORDER — OZEMPIC (0.25 OR 0.5 MG/DOSE) 2 MG/1.5ML ~~LOC~~ SOPN: 0.5000 mg | PEN_INJECTOR | SUBCUTANEOUS | 0 refills | Status: DC

## 2021-01-26 ENCOUNTER — Encounter: Payer: PRIVATE HEALTH INSURANCE | Admitting: Physician Assistant

## 2021-01-26 NOTE — Progress Notes (Signed)
Erroneous encounter. Please disregard. Patient has appt with PCP on Friday. Declines video visit today.

## 2021-01-28 ENCOUNTER — Other Ambulatory Visit: Payer: Self-pay

## 2021-01-28 ENCOUNTER — Encounter: Payer: Self-pay | Admitting: Family Medicine

## 2021-01-28 ENCOUNTER — Ambulatory Visit (INDEPENDENT_AMBULATORY_CARE_PROVIDER_SITE_OTHER): Payer: PRIVATE HEALTH INSURANCE | Admitting: Family Medicine

## 2021-01-28 VITALS — BP 120/60 | HR 93 | Ht 69.0 in | Wt 352.0 lb

## 2021-01-28 DIAGNOSIS — E782 Mixed hyperlipidemia: Secondary | ICD-10-CM | POA: Diagnosis not present

## 2021-01-28 DIAGNOSIS — R2241 Localized swelling, mass and lump, right lower limb: Secondary | ICD-10-CM

## 2021-01-28 DIAGNOSIS — E1169 Type 2 diabetes mellitus with other specified complication: Secondary | ICD-10-CM | POA: Diagnosis not present

## 2021-01-28 DIAGNOSIS — E119 Type 2 diabetes mellitus without complications: Secondary | ICD-10-CM | POA: Diagnosis not present

## 2021-01-28 DIAGNOSIS — E785 Hyperlipidemia, unspecified: Secondary | ICD-10-CM

## 2021-01-28 LAB — POCT GLYCOSYLATED HEMOGLOBIN (HGB A1C): Hemoglobin A1C: 10.6 % — AB (ref 4.0–5.6)

## 2021-01-28 MED ORDER — METFORMIN HCL 500 MG PO TABS: 500.0000 mg | ORAL_TABLET | Freq: Two times a day (BID) | ORAL | 3 refills | Status: DC

## 2021-01-28 NOTE — Progress Notes (Signed)
SUBJECTIVE:   CHIEF COMPLAINT / HPI:   Diabetes: Last A1c in August 2021 12.2%. Today A1c is 10.6%. Currently taking nothing for his diabetes.  Patient has been prescribed Metformin and Ozempic in the past, but reports he is taking neither of these.  I asked the patient to please start taking his Metformin daily.  He needs eye exam and Pneumo vaccine.  HLD: Patient with history of hyperlipidemia.  Patient reported poor compliance with atorvastatin 40 mg.  Last lipid panel in August 2021, desired recheck today 5 months later to ensure compliance with atorvastatin.  Foot swelling: has had this evaluated with sports medicine in August and October of 2020, significant swelling was appreciated however no identifiable cause was found. It occasionally hurts and swells, swelling has gone up his R shin. He is working again and on his foot more after walking 2-3 hours later.  Patient denies pain in right calf, shortness of breath.  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  Last echo in March 2020 showed normal ejection fraction and diastolic parameters.  Health maintenance: would benefit from Hep C screenining, COVID Vaccine, Pneumo vaccine, tetanus vaccine, and flu vaccine  PERTINENT  PMH / PSH:  Patient Active Problem List   Diagnosis Date Noted  . Localized swelling of right lower extremity 08/13/2020  . Obstructive sleep apnea 03/26/2020  . Excessive daytime sleepiness 11/07/2019  . Bilateral foot pain 07/09/2019  . LVH (left ventricular hypertrophy) 06/12/2019  . Hyperlipidemia associated with type 2 diabetes mellitus (HCC) 06/12/2019  . Morbid obesity (HCC) 04/16/2019  . Hypertension 04/16/2019  . 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 Type 2 diabetes mellitus without complication, without long-term current use of insulin (HCC) 04/16/2019    OBJECTIVE:   BP 120/60   Pulse 93   Ht 5\' 9"  (1.753 m)   Wt (!) 352 lb (159.7 kg)   SpO2  97%   BMI 51.98 kg/m    Physical exam: General: Well-appearing, nontoxic-appearing Respiratory: Comfortable work of breathing Extremity: No gross deformity appreciated, right lower extremity mildly more swollen than left lower extremity, nontender to palpation, 1+ pitting edema appreciated to right lower extremity up to mid shin, trace pitting edema appreciated to left lower extremity, 2+ DP pulses appreciated bilaterally, 5/5 strength with normal ambulation ASSESSMENT/PLAN:   Localized swelling of right lower extremity Patient with continued swelling and pain of right lower extremity, particularly around foot without any focal tenderness appreciated at today's visit.  Patient is been evaluated in the past by sports medicine with notable swelling without evident cause. -X-ray of patient's right foot ordered at today's visit -Checking TSH and CBC to rule out other causes of patient's edema -Consider checking uric acid level at next appointment -Vascular ultrasound of lower extremity also ordered at today's visit DVT -Please verify that patient got his x-ray done, if he did not please follow-up that he knows where to go for his foot x-ray [facility])  Type 2 diabetes mellitus without complication, without long-term current use of insulin (HCC) A1c today remains poorly controlled at 10.6%, however improved from previous value of 12.2%.  Patient reports he is taking neither his Metformin nor his semaglutide. -Patient was encouraged to start taking Metformin twice daily (patient given refill for Metformin 500 mg twice daily) -Patient plans to follow-up next week for additional diabetes education  Hyperlipidemia associated with type 2 diabetes mellitus (HCC) Patient with significantly elevated triglycerides in August 2021.  Patient is rarely compliant with his atorvastatin 40 mg. -Patient's lipid panel checked again today 5 months after previous reading -Consider rechecking while  fasting     [name], DO [facility]

## 2021-01-28 NOTE — Patient Instructions (Signed)
Thank you for coming in to see [place] today! Please see below to review our plan for today's visit:  1. Go get your Xray. 2. Start taking Metformin twice daily.  3. I am checking labs and would like for you to follow up with [place] within a week or so.   Please call the clinic at [phone] if your symptoms worsen or you have any concerns. It was our pleasure to serve you!   Dr. Peggyann Shoals [facility]

## 2021-01-28 NOTE — Assessment & Plan Note (Signed)
>>  ASSESSMENT AND PLAN FOR LOCALIZED SWELLING OF RIGHT LOWER EXTREMITY WRITTEN ON 01/28/2021  6:26 PM BY ANDERSON, HANNAH C, DO  Patient with continued swelling and pain of right lower extremity, particularly around foot without any focal tenderness appreciated at today's visit.  Patient is been evaluated in the past by sports medicine with notable swelling without evident cause. -X-ray of patient's right foot ordered at today's visit -Checking TSH and CBC to rule out other causes of patient's edema -Consider checking uric acid level at next appointment -Vascular ultrasound of lower extremity also ordered at today's visit DVT -Please verify that patient got his x-ray done, if he did not please follow-up that he knows where to go for his foot x-ray [facility])

## 2021-01-28 NOTE — Assessment & Plan Note (Signed)
Patient with continued swelling and pain of right lower extremity, particularly around foot without any focal tenderness appreciated at today's visit.  Patient is been evaluated in the past by sports medicine with notable swelling without evident cause. -X-ray of patient's right foot ordered at today's visit -Checking TSH and CBC to rule out other causes of patient's edema -Consider checking uric acid level at next appointment -Vascular ultrasound of lower extremity also ordered at today's visit DVT -Please verify that patient got his x-ray done, if he did not please follow-up that he knows where to go for his foot x-ray [facility])

## 2021-01-28 NOTE — Assessment & Plan Note (Signed)
Patient with significantly elevated triglycerides in August 2021.  Patient is rarely compliant with his atorvastatin 40 mg. -Patient's lipid panel checked again today 5 months after previous reading -Consider rechecking while fasting

## 2021-01-28 NOTE — Assessment & Plan Note (Signed)
A1c today remains poorly controlled at 10.6%, however improved from previous value of 12.2%.  Patient reports he is taking neither his Metformin nor his semaglutide. -Patient was encouraged to start taking Metformin twice daily (patient given refill for Metformin 500 mg twice daily) -Patient plans to follow-up next week for additional diabetes education

## 2021-01-29 LAB — TSH: TSH: 1.19 u[IU]/mL (ref 0.450–4.500)

## 2021-01-29 LAB — CBC
Hematocrit: 45.8 % (ref 37.5–51.0)
Hemoglobin: 14.7 g/dL (ref 13.0–17.7)
MCH: 26.4 pg — ABNORMAL LOW (ref 26.6–33.0)
MCHC: 32.1 g/dL (ref 31.5–35.7)
MCV: 82 fL (ref 79–97)
Platelets: 419 10*3/uL (ref 150–450)
RBC: 5.56 x10E6/uL (ref 4.14–5.80)
RDW: 13.1 % (ref 11.6–15.4)
WBC: 8.8 10*3/uL (ref 3.4–10.8)

## 2021-01-29 LAB — LIPID PANEL
Chol/HDL Ratio: 7.2 ratio — ABNORMAL HIGH (ref 0.0–5.0)
Cholesterol, Total: 215 mg/dL — ABNORMAL HIGH (ref 100–199)
HDL: 30 mg/dL — ABNORMAL LOW (ref >39)
LDL Chol Calc (NIH): 133 mg/dL — ABNORMAL HIGH (ref 0–99)
Triglycerides: 286 mg/dL — ABNORMAL HIGH (ref 0–149)
VLDL Cholesterol Cal: 52 mg/dL — ABNORMAL HIGH (ref 5–40)

## 2021-02-01 ENCOUNTER — Telehealth: Payer: Self-pay

## 2021-02-01 NOTE — Telephone Encounter (Signed)
-----   Message from [name], DO sent at 01/28/2021  6:28 PM EST ----- Regarding: Scheduling DVT ultrasound for patient Fred Reyes, storry to bug you again, would you mind scheduling a lower extremity ultrasound to evaluate if this patient is having a DVT?  His work schedule keep some kind of busy so it may be a matter of checking in with him to see when he has not working before scheduling.  I placed the order but it was after his appointment time.  Peggyann Shoals, DO [facility], PGY-3 01/28/2021 6:29 PM

## 2021-02-01 NOTE — Telephone Encounter (Signed)
Attempted to reach pt. No answer. LVM for pt to just walk into any [facility] location to have x-tay. You dont need appointments for x-ray. I gave him the address for [facility] at [address] Aquilla Solian, CMA

## 2021-02-02 ENCOUNTER — Other Ambulatory Visit: Payer: Self-pay

## 2021-02-02 ENCOUNTER — Ambulatory Visit (HOSPITAL_COMMUNITY)
Admission: RE | Admit: 2021-02-02 | Discharge: 2021-02-02 | Disposition: A | Discharge status: Home / Self Care | Payer: Self-pay | Source: Ambulatory Visit | Attending: Family Medicine | Admitting: Family Medicine

## 2021-02-02 ENCOUNTER — Ambulatory Visit (INDEPENDENT_AMBULATORY_CARE_PROVIDER_SITE_OTHER): Payer: Self-pay | Admitting: Family Medicine

## 2021-02-02 DIAGNOSIS — R2241 Localized swelling, mass and lump, right lower limb: Secondary | ICD-10-CM | POA: Insufficient documentation

## 2021-02-02 NOTE — Assessment & Plan Note (Signed)
>>  ASSESSMENT AND PLAN FOR LOCALIZED SWELLING OF RIGHT LOWER EXTREMITY WRITTEN ON 02/02/2021  4:34 PM BY CHANDRA TORIBIO POUR, MD  Given the chronic nature of his swelling and the worsening with ambulation improvement with rest, would favor venous insufficiency of unknown cause for lymphatic obstruction.  Cannot rule out DVT.  Ordered DVT ultrasound for patient which is scheduled for later today.  Advised to follow-up with x-ray of foot as well.

## 2021-02-02 NOTE — Progress Notes (Signed)
Lower extremity venous has been completed.   Preliminary results in CV Proc.   Blanch Media 02/02/2021 4:17 PM

## 2021-02-02 NOTE — Assessment & Plan Note (Signed)
Given the chronic nature of his swelling and the worsening with ambulation improvement with rest, would favor venous insufficiency of unknown cause for lymphatic obstruction.  Cannot rule out DVT.  Ordered DVT ultrasound for patient which is scheduled for later today.  Advised to follow-up with x-ray of foot as well.

## 2021-02-02 NOTE — Progress Notes (Signed)
    SUBJECTIVE:   CHIEF COMPLAINT / HPI:   Foot swelling: Patient here for follow-up from last week.  He has not gotten the x-ray or the DVT ultrasound since that time.  Swelling in his leg has remained unchanged in that time.  Continues to be worse with prolonged periods of ambulation.  Improves after rest.  Has been going on since October 2020.  Discussed patient's blood test from earlier last week.  Discussed low carbohydrate, low saturated fat diets.  Patient states he is going to pick up his Metformin from the pharmacy soon.    PERTINENT  PMH / PSH: Diabetes  OBJECTIVE:   BP 118/62   Pulse 86   Wt (!) 351 lb (159.2 kg)   SpO2 98%   BMI 51.83 kg/m   General: Alert, oriented.  No acute distress obese.  Appears older than stated age MSK: Right lower extremity with 3+ pitting edema from the ankle to the knee.  No edema on the foot.  Right lower extremity larger in circumference compared to left.  ASSESSMENT/PLAN:   Localized swelling of right lower extremity Given the chronic nature of his swelling and the worsening with ambulation improvement with rest, would favor venous insufficiency of unknown cause for lymphatic obstruction.  Cannot rule out DVT.  Ordered DVT ultrasound for patient which is scheduled for later today.  Advised to follow-up with x-ray of foot as well.     Sandre Kitty, MD [facility]

## 2021-02-02 NOTE — Patient Instructions (Signed)
It was nice to see you today  Your lab tests were improved, although still elevated.  Eating a low carbohydrate, low in saturated fat diet will help your cholesterol and diabetes.  I would encourage you to take your Metformin has prescribed and follow-up closely with your PCP, Dr. [name].  We are scheduling you with a DVT ultrasound to rule out this as a potential cause of your swelling.  I will call you with the results when I get them.  If you have any difficulty breathing or chest pain during this time please seek to call attention.  Have a great day,  Frederic Jericho, MD

## 2021-02-04 ENCOUNTER — Telehealth: Payer: Self-pay | Admitting: Family Medicine

## 2021-02-04 ENCOUNTER — Encounter: Payer: Self-pay | Admitting: Family Medicine

## 2021-02-04 NOTE — Telephone Encounter (Signed)
Patient is returning a missed call from a nurse. He did not remember the name of who called. He said he thinks it is to discuss results from his xray. He would like for someone to call back to discuss.   The best call back is [phone].

## 2021-02-04 NOTE — Telephone Encounter (Signed)
Spoke with pt he stated that he had an appt with [facility] on 2/16, he also has an appt for evaluation ORTHO on 3/9. He said that he will go get the x-ray of his ankle on 2/22 at [facility]. Aquilla Solian, CMA

## 2021-02-10 ENCOUNTER — Encounter: Payer: Self-pay | Admitting: Family Medicine

## 2021-02-17 ENCOUNTER — Telehealth: Payer: Self-pay

## 2021-02-17 NOTE — Telephone Encounter (Signed)
Patient calls nurse line regarding rx for diuretic. Explained provider's previous mychart message and that per provider, diuretic therapy is not indicated at this time. Patient has further questions and would like to speak to provider.   Advised patient that provider is currently on a night shift rotation. Patient states that provider can call him at any time.   To PCP  Please advise.   Veronda Prude, RN

## 2021-02-17 NOTE — Telephone Encounter (Signed)
Called and discussed Mr. Fred Reyes venous insufficiency. He was informed that this is not a dangerous condition and it will not cause harm to his leg.  He was again advised to treat with compression socks, regular activity and weight loss.  He again asked for diuretics and was told that diuretics are not the preferred treatment for venous insufficiency.  Mirian Mo, MD

## 2021-02-23 ENCOUNTER — Ambulatory Visit: Payer: PRIVATE HEALTH INSURANCE | Attending: Family Medicine | Admitting: Physical Therapy

## 2021-04-08 ENCOUNTER — Encounter: Payer: Self-pay | Admitting: Podiatry

## 2021-04-08 ENCOUNTER — Other Ambulatory Visit: Payer: Self-pay

## 2021-04-08 ENCOUNTER — Ambulatory Visit (INDEPENDENT_AMBULATORY_CARE_PROVIDER_SITE_OTHER): Payer: Medicaid Other

## 2021-04-08 ENCOUNTER — Ambulatory Visit (INDEPENDENT_AMBULATORY_CARE_PROVIDER_SITE_OTHER): Payer: Self-pay | Admitting: Podiatry

## 2021-04-08 DIAGNOSIS — M79671 Pain in right foot: Secondary | ICD-10-CM

## 2021-04-08 DIAGNOSIS — R600 Localized edema: Secondary | ICD-10-CM

## 2021-04-12 NOTE — Progress Notes (Signed)
Subjective:   Patient ID: Fred Reyes, male   DOB: 30 y.o.   MRN: [id number]   HPI Patient presents stating he has had some edema in the right foot for 2 years its not painful but he was just wondering if it is possible to be treated.  Patient does not smoke and is obese   Review of Systems  All other systems reviewed and are negative.       Objective:  Physical Exam Vitals and nursing note reviewed.  Constitutional:      Appearance: He is well-developed.  Pulmonary:     Effort: Pulmonary effort is normal.  Musculoskeletal:        General: Normal range of motion.  Skin:    General: Skin is warm.  Neurological:     Mental Status: He is alert.     Neurovascular status intact negative Homans' sign noted range of motion adequate subtalar midtarsal joint and muscle strength adequate.  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  The left does not appear to have the same degree of varicosities or other pathology     Assessment:  Probability that were dealing here with problems of the vein consistent with obesity is the overriding factor H     Plan:  PA x-rays reviewed education condition explained to patient.  I do think that compression could be of value elevation but I do think he needs to lose weight and see a vein specialist and I explained both of these in great deal to him  X-rays indicated that there is some osteopia and also some arthritis occurring secondary to stress on the feet from obesity

## 2021-04-19 ENCOUNTER — Other Ambulatory Visit: Payer: Self-pay | Admitting: Family Medicine

## 2021-04-19 MED ORDER — OZEMPIC (0.25 OR 0.5 MG/DOSE) 2 MG/1.5ML ~~LOC~~ SOPN: 0.5000 mg | PEN_INJECTOR | SUBCUTANEOUS | 0 refills | Status: DC

## 2021-04-21 ENCOUNTER — Telehealth: Payer: Self-pay | Admitting: Pharmacist

## 2021-04-21 MED ORDER — OZEMPIC (0.25 OR 0.5 MG/DOSE) 2 MG/1.5ML ~~LOC~~ SOPN: 0.5000 mg | PEN_INJECTOR | SUBCUTANEOUS | 11 refills | Status: DC

## 2021-04-21 NOTE — Telephone Encounter (Signed)
Noted and agree. 

## 2021-04-21 NOTE — Telephone Encounter (Signed)
Patient new prescription request sent to pharmacy [facility]) for [employer].

## 2021-04-21 NOTE — Telephone Encounter (Signed)
-----   Message from Shona Simpson, CPhT sent at 04/21/2021 10:46 AM EDT ----- Regarding: RE: Semaglutide Sample Just spoke with pt, he is currently still taking the medication. He stated he's been requesting refills but hasnt rec'd them. He also said his insurance has covered it in the past. I told him if the copay is too high or if it isnt covered to give the office a call back and ask for me.  Could this RX be sent to [facility] at [employer]? Pt works there and will follow up with the pharmacy in a few.  ----- Message ----- From: Kathrin Ruddy, RPH-CPP Sent: 04/21/2021  10:24 AM EDT To: Shona Simpson, CPhT Subject: FW: Semaglutide Sample                          ----- Message ----- From: [name], DO Sent: 04/19/2021   9:50 PM EDT To: Kathrin Ruddy, RPH-CPP Subject: Semaglutide Sample                             Hello Dr. Raymondo Band,   This is a patient seen by Dr. [name] (I'm covering his inbox this week while he is away). A refill was requested for his Semaglutide once weekly injection 0.5mg  weekly; however, at his most recent appt with podiatry he said he was not taking this medicine. I'm not sure if he meant that he had run out rather did not want to be taking it any more.   I refilled his medication as a SAMPLE through the pharmacy team at clinic, if a sample is available for him. I will call him in the morning and clarify all of this with him, but wanted to let you know about his situation.   Thanks!  Peggyann Shoals, DO [facility], PGY-3 04/19/2021 9:50 PM

## 2021-04-22 ENCOUNTER — Other Ambulatory Visit: Payer: Self-pay | Admitting: Family Medicine

## 2021-04-22 DIAGNOSIS — E119 Type 2 diabetes mellitus without complications: Secondary | ICD-10-CM

## 2021-04-22 MED ORDER — OZEMPIC (0.25 OR 0.5 MG/DOSE) 2 MG/1.5ML ~~LOC~~ SOPN: 0.5000 mg | PEN_INJECTOR | SUBCUTANEOUS | 1 refills | Status: DC

## 2021-05-10 ENCOUNTER — Ambulatory Visit: Payer: Medicaid Other | Admitting: Medical

## 2021-07-22 ENCOUNTER — Ambulatory Visit: Payer: Self-pay | Admitting: Family Medicine

## 2021-08-04 ENCOUNTER — Other Ambulatory Visit: Payer: Self-pay

## 2021-08-04 ENCOUNTER — Ambulatory Visit (INDEPENDENT_AMBULATORY_CARE_PROVIDER_SITE_OTHER): Payer: Self-pay | Admitting: Family Medicine

## 2021-08-04 ENCOUNTER — Encounter: Payer: Self-pay | Admitting: Family Medicine

## 2021-08-04 VITALS — BP 122/90 | HR 86 | Ht 69.0 in | Wt 353.0 lb

## 2021-08-04 DIAGNOSIS — E785 Hyperlipidemia, unspecified: Secondary | ICD-10-CM

## 2021-08-04 DIAGNOSIS — M79661 Pain in right lower leg: Secondary | ICD-10-CM

## 2021-08-04 DIAGNOSIS — E1169 Type 2 diabetes mellitus with other specified complication: Secondary | ICD-10-CM

## 2021-08-04 DIAGNOSIS — M7989 Other specified soft tissue disorders: Secondary | ICD-10-CM

## 2021-08-04 DIAGNOSIS — I10 Essential (primary) hypertension: Secondary | ICD-10-CM

## 2021-08-04 DIAGNOSIS — E119 Type 2 diabetes mellitus without complications: Secondary | ICD-10-CM

## 2021-08-04 LAB — POCT GLYCOSYLATED HEMOGLOBIN (HGB A1C): HbA1c, POC (controlled diabetic range): 11.1 % — AB (ref 0.0–7.0)

## 2021-08-04 MED ORDER — METFORMIN HCL 500 MG PO TABS: 500.0000 mg | ORAL_TABLET | Freq: Two times a day (BID) | ORAL | 3 refills | Status: DC

## 2021-08-04 MED ORDER — ATORVASTATIN CALCIUM 40 MG PO TABS: 40.0000 mg | ORAL_TABLET | Freq: Every day | ORAL | 3 refills | Status: DC

## 2021-08-04 MED ORDER — OZEMPIC (0.25 OR 0.5 MG/DOSE) 2 MG/1.5ML ~~LOC~~ SOPN: 0.5000 mg | PEN_INJECTOR | SUBCUTANEOUS | 1 refills | Status: DC

## 2021-08-04 MED ORDER — VALSARTAN 80 MG PO TABS: 80.0000 mg | ORAL_TABLET | Freq: Every day | ORAL | 3 refills | Status: DC

## 2021-08-04 NOTE — Assessment & Plan Note (Addendum)
A1c today 11.1. Patient has not been taking any of his medications for over 2 months, as he states he was out of refills.  Discussed the importance of taking all medications and ensuring he has refills as needed. - Refilled metformin, Ozempic, atorvastatin - Referral to medial nutritionist for diabetes education placed - F/u with A1c in 3 months

## 2021-08-04 NOTE — Patient Instructions (Addendum)
It was so great meeting you today!  Today we discussed the following:  Right leg swelling: My concern is that this is related to venous insufficiency, which is not typical at your age. I have sent in a referral to vascular surgery who should be in contact with you in the next few weeks. I do recommend making sure to elevate your foot when you are able (the preference would be above the level of your heart, but as long as it is not below your body that will be helpful). I also recommend getting compression stockings and wearing those, especially when you will be on your feet during the day.   Type 2 diabetes: It is very important in for your future health that we make sure we get control of your diabetes as early as we can.  I sent in the refills for the metformin and Ozempic and atorvastatin.  Will be very importantly take all these medications and make sure that when you are out of the medications you call the pharmacy and our office to get those refills done.  I want you to follow-up in the next 3 months so that we can check on your A1c.  I have made a referral to medical nutrition therapy for more education and teaching you how to check your sugars.  It will also be important to have an eye exam done by an ophthalmologist as your eyes are easily affected by diabetes when it is not controlled.   Preventing Diabetes Mellitus Complications You can help to prevent or slow down problems that are caused by diabetes (diabetes mellitus). Following your diabetes plan and taking care of yourself can reduce yourrisk of serious or life-threatening complications. What actions can I take to prevent diabetes complications? Diabetes management  Follow instructions from your health care providers about managing your diabetes. Your diabetes may be managed by a team of health care providers who can teach you how to care for yourself and can answer questions that you have. Educate yourself about your condition so you  can make healthy choices about eating and physical activity. Know your target range for your blood sugar (glucose), and check your blood glucose level as often as told. Your health care provider will help you decide how often to check your blood glucose level depending on your treatment goals and how well you are meeting them. Ask your health care provider if you should take low-dose aspirin daily and what dose is recommended for you. Taking low-dose aspirin daily is recommended to help prevent cardiovascular disease.  Controlling your blood pressure and cholesterol Your personal target blood pressure is determined based on: Your age. Your medicines. How long you have had diabetes. Any other medical conditions you have. To control your blood pressure: Follow instructions from your health care provider about meal planning, exercise, and medicines. Make sure your health care provider checks your blood pressure at every medical visit. Monitor your blood pressure at home as told by your health care provider. To control your cholesterol: Follow instructions from your health care provider about meal planning, exercise, and medicines. Have your cholesterol checked at least once a year. You may be prescribed medicine to lower cholesterol (statin). If you are not taking a statin, ask your health care provider if you should be. Controlling your cholesterol may: Help prevent heart disease and stroke. These are the most common health problems for people with diabetes. Improve your blood flow.  Medical appointments and vaccines Schedule and keep yearly  physical exams and eye exams. Your health care provider will tell you how often you need medical visits depending on your diabetes management plan. Keep all follow-up visits as told. This is important so possible problems can be identified early and complications can be avoided or treated. Every visit with your health care provider should include measuring  your: Weight. Blood pressure. Blood glucose control. Your A1C (hemoglobin A1C) level should be checked: At least 2 times a year, if you are meeting your treatment goals. 4 times a year, if you are not meeting treatment goals or if your treatment goals have changed. Your blood lipids (lipid profile) should be checked yearly. You should also be checked yearly for protein in your urine (urine microalbumin). If you have type 1 diabetes, get an eye exam 3-5 years after you are diagnosed, and then once a year after your first exam. If you have type 2 diabetes, get an eye exam as soon as you are diagnosed, and then once a year after your first exam. It is also important to keep your vaccines current. It is recommended that you receive: A flu (influenza) vaccine every year. A pneumonia (pneumococcal) vaccine and a hepatitis B vaccine. If you are age 12 or older, you may get the pneumonia vaccine as a series of two separate shots. Ask your health care provider which other vaccines may be recommended. Lifestyle Do not use any products that contain nicotine or tobacco, such as cigarettes, e-cigarettes, and chewing tobacco. If you need help quitting, ask your health care provider. By avoiding nicotine and tobacco: You will lower your risk for heart attack, stroke, nerve disease, and kidney disease. Your cholesterol and blood pressure may improve. Your blood circulation will improve. If you drink alcohol: Limit how much you use to: 0-1 drink a day for women who are not pregnant. 0-2 drinks a day for men. Be aware of how much alcohol is in your drink. In the [place] one drink equals one 12 oz bottle of beer (355 mL), one 5 oz glass of wine (148 mL), or one 11?2 oz glass of hard liquor (44 mL). Taking care of your feet Diabetes may cause you to have poor blood circulation to your legs and feet. Because of this, taking care of your feet is very important. Diabetes can cause: The skin on the feet to get  thinner, break more easily, and heal more slowly. Nerve damage in your legs and feet, which results in decreased feeling. You may not notice minor injuries that could lead to serious problems. To avoid foot problems: Check your skin and feet every day for cuts, bruises, redness, blisters, or sores. Schedule a foot exam with your health care provider once every year. This exam includes: Inspecting the structure and skin of your feet. Checking the pulses and sensation in your feet. Make sure that your health care provider performs a visual foot exam at every medical visit.  Taking care of your teeth People with poorly controlled diabetes are more likely to have gum (periodontal) disease. Diabetes can make periodontal diseases harder to control. If not treated, periodontal diseases can lead to tooth loss. To prevent this: Brush your teeth twice a day. Floss at least once a day. Visit your dentist 2 times a year. Managing stress Living with diabetes can be stressful. When you are experiencing stress, your blood glucose may be affected in two ways: Stress hormones may cause your blood glucose to rise. You may be distracted from taking good care  of yourself. Be aware of your stress level and make changes to help you manage challenging situations. To lower your stress levels: Consider joining a support group. Do planned relaxation or meditation. Do a hobby that you enjoy. Maintain healthy relationships. Exercise regularly. Work with your health care provider or a mental health professional. Where to find more information American Diabetes Association: www.diabetes.org Association of Diabetes Care and Education Specialists: www.diabeteseducator.org Summary You can take action to prevent or slow down problems that are caused by diabetes (diabetes mellitus). Following your diabetes plan and taking care of yourself can reduce your risk of serious or life-threatening complications. Follow  instructions from your health care providers about managing your diabetes. Your diabetes may be managed by a team of health care providers who can teach you how to care for yourself and can answer questions that you have. Know your target range for your blood sugar (glucose), and check your blood glucose levels as often as told. Your health care provider will help you decide how often you should check your blood glucose level depending on your treatment goals and how well you are meeting them. Your health care provider will tell you how often you need medical visits depending on your diabetes management plan. Keep all follow-up visits as directed. This is important so possible problems can be identified early and complications can be avoided or treated. This information is not intended to replace advice given to you by your health care provider. Make sure you discuss any questions you have with your healthcare provider. Document Revised: 01/23/2020 Document Reviewed: 01/23/2020 Elsevier Patient Education  2022 [employer].

## 2021-08-04 NOTE — Assessment & Plan Note (Addendum)
Worsening for the last 2 years, DVT ultrasound completed in February 2022 with no signs of DVT at that time.  New hyperpigmented areas in the lower extremity.  No signs of infection.  Findings concerning for venous insufficiency, which is concerning given patient's young age.  Point-of-care ABIs were done in the office and were around 1 for each leg.  No known reason/risk for an obstruction at this time but may consider needing a CT or imaging in the future if warranted. - Referral to vascular surgery for further evaluation of possible venous insufficiency placed - Return precautions discussed - ED precautions for pain out of proportion discussed - Educated on elevation and compression stockings - Reinforced importance of medication adherence for blood pressure control, cholesterol control and diabetes control

## 2021-08-04 NOTE — Progress Notes (Signed)
SUBJECTIVE:   CHIEF COMPLAINT / HPI:   Diabetes, Type 2 Patient reports that he has been out of his medications for over 2 months. - Last A1c 10.6 (01/28/21) - Medications: Ozempic 0.5mg  weekly, y - Compliance: No, he has not gotten a refill. Last dose >2 months ago. - Checking BG at home: No, has never had education on this  - Diet: drinking a lot of sodas and eating fried foods - Exercise: No dedicated exercise, but walks at work - Eye exam: none - Foot exam: None - Statin: atorvastatin 40mg  daily - Denies symptoms of hypoglycemia, polyuria, polydipsia, numbness extremities, foot ulcers/trauma   Right leg swelling Patient reports that he has been having an issue with his right leg swelling for the last 2 years.  On 02/02/2021 patient was seen in the clinic and a DVT ultrasound was completed with no signs of DVT at that time.  Patient reports that over the last 1 to 2 months he is started noticing a black or color in his leg.  He also has pain that worsens with his ambulation and when laying in bed at night.  The pain during the day is typically aching but his pain at night is sharp in nature.  He does walk every day while working at 02/04/2021 but will end up limping a couple of hours into his shift secondary to the pain.    PERTINENT  PMH / PSH: Reviewed  OBJECTIVE:   BP 122/90   Pulse 86   Ht 5\' 9"  (1.753 m)   Wt (!) 353 lb (160.1 kg)   SpO2 97%   BMI 52.13 kg/m   Gen: well-appearing, NAD CV: RRR, 2+ pedal pulses Pulm: CTAB, no wheezes/crackles GI: soft, non-tender, non-distended Skin: hyperpigmented areas noted on right lower extremity with induration present, no erythema or warmth or signs of infection, no wound or open areas of skin present.  POCT ABI measurements: - Arm 156 - Left ankle 157 (ABI 1.006) - Right ankle 158 (ABI 1.01)  ASSESSMENT/PLAN:   Pain and swelling of right lower leg Worsening for the last 2 years, DVT ultrasound completed in February 2022  with no signs of DVT at that time.  New hyperpigmented areas in the lower extremity.  No signs of infection.  Findings concerning for venous insufficiency, which is concerning given patient's young age.  Point-of-care ABIs were done in the office and were around 1 for each leg.  No known reason/risk for an obstruction at this time but may consider needing a CT or imaging in the future if warranted. - Referral to vascular surgery for further evaluation of possible venous insufficiency placed - Return precautions discussed - ED precautions for pain out of proportion discussed - Educated on elevation and compression stockings - Reinforced importance of medication adherence for blood pressure control, cholesterol control and diabetes control  Type 2 diabetes mellitus without complication, without long-term current use of insulin (HCC) A1c today 11.1. Patient has not been taking any of his medications for over 2 months, as he states he was out of refills.  Discussed the importance of taking all medications and ensuring he has refills as needed. - Refilled metformin, Ozempic, atorvastatin - Referral to medial nutritionist for diabetes education placed - F/u with A1c in 3 months  Hyperlipidemia associated with type 2 diabetes mellitus (HCC) Refilled atorvastatin, has not been taking for several months. Goal LDL 100mg /dL, goal TG 150mg /dL. - LDL and TG at next visit  Evelena Leyden, DO [facility]

## 2021-08-04 NOTE — Assessment & Plan Note (Signed)
>>  ASSESSMENT AND PLAN FOR PAIN AND SWELLING OF RIGHT LOWER LEG WRITTEN ON 08/04/2021 12:16 PM BY LILLAND, ALANA, DO  Worsening for the last 2 years, DVT ultrasound completed in February 2022 with no signs of DVT at that time.  New hyperpigmented areas in the lower extremity.  No signs of infection.  Findings concerning for venous insufficiency, which is concerning given patient's young age.  Point-of-care ABIs were done in the office and were around 1 for each leg.  No known reason/risk for an obstruction at this time but may consider needing a CT or imaging in the future if warranted. - Referral to vascular surgery for further evaluation of possible venous insufficiency placed - Return precautions discussed - ED precautions for pain out of proportion discussed - Educated on elevation and compression stockings - Reinforced importance of medication adherence for blood pressure control, cholesterol control and diabetes control

## 2021-08-04 NOTE — Assessment & Plan Note (Signed)
Refilled atorvastatin, has not been taking for several months. Goal LDL 100mg /dL, goal TG 150mg /dL. - LDL and TG at next visit

## 2021-09-14 ENCOUNTER — Encounter: Payer: No Typology Code available for payment source | Attending: Family Medicine | Admitting: Registered"

## 2021-09-20 ENCOUNTER — Other Ambulatory Visit: Payer: Self-pay

## 2021-09-20 DIAGNOSIS — R2241 Localized swelling, mass and lump, right lower limb: Secondary | ICD-10-CM

## 2021-09-26 ENCOUNTER — Ambulatory Visit: Payer: Self-pay | Admitting: Family Medicine

## 2021-10-05 ENCOUNTER — Encounter (HOSPITAL_COMMUNITY): Payer: Self-pay

## 2021-11-14 ENCOUNTER — Encounter (HOSPITAL_COMMUNITY): Payer: Self-pay

## 2021-11-28 NOTE — Patient Instructions (Addendum)
It was nice seeing you today!  Make an appointment with the vascular specialists.  For your dark skin, I recommend using a moisturizer or Vaseline.  Elevate your legs as much as possible.  Wear compression stockings as much as possible.  Avoid eating salty foods or things with a lot of sodium (restaurant foods, canned/frozen foods).  Go to your appointments as scheduled.  Please arrive at least 15 minutes prior to your scheduled appointments.  Stay well, Fred Deeds, MD [facility] [phone]

## 2021-11-28 NOTE — Progress Notes (Signed)
    SUBJECTIVE:   CHIEF COMPLAINT / HPI: leg swelling  Ongoing leg swelling in right leg for 2 years. DVT US negative 01/2021. ABI done in office last visit in August were normal. Referred to vascular surgery. It appears he no-showed two appointments with vascular surgery in the interim.  Patient still with continued leg swelling worse in the right leg.  Using compression stockings on occasion.  It has been difficult to put on sometimes.  No new symptoms.  He missed his appointment with vascular because he forgot about the appointments and states that they did not remind him.  PERTINENT  PMH / PSH: HTN, T2DM  OBJECTIVE:   BP (!) 142/90   Pulse 79   Ht 5\' 9"  (1.753 m)   Wt (!) 346 lb 12.8 oz (157.3 kg)   SpO2 99%   BMI 51.21 kg/m   General: Obese young male, NAD CV: RRR, no murmurs, 2+ DP pulses Pulm: CTAB, no wheezes or rales Extremities: Nonpitting edema worse on the right, stasis dermatitis changes noted worse on the right     ASSESSMENT/PLAN:   Localized swelling of right lower extremity Chronic.  Prior work-up including ABI and DVT ultrasound negative.  Suspect venous insufficiency given venous stasis changes.  Advised to reschedule appointment with vascular specialist.  Continue to use compression stockings as able, advised to elevate legs and avoid sodium.  Recommended moisturizer for legs.  Hypertension Mildly elevated, no changes today.  Consider medication adjustment if continues to be elevated.     , MD [facility]

## 2021-11-30 ENCOUNTER — Encounter: Payer: Self-pay | Admitting: Family Medicine

## 2021-11-30 ENCOUNTER — Ambulatory Visit (INDEPENDENT_AMBULATORY_CARE_PROVIDER_SITE_OTHER): Payer: Self-pay | Admitting: Family Medicine

## 2021-11-30 ENCOUNTER — Other Ambulatory Visit: Payer: Self-pay

## 2021-11-30 VITALS — BP 142/90 | HR 79 | Ht 69.0 in | Wt 346.8 lb

## 2021-11-30 DIAGNOSIS — I10 Essential (primary) hypertension: Secondary | ICD-10-CM

## 2021-11-30 DIAGNOSIS — M7989 Other specified soft tissue disorders: Secondary | ICD-10-CM

## 2021-11-30 DIAGNOSIS — R2241 Localized swelling, mass and lump, right lower limb: Secondary | ICD-10-CM

## 2021-11-30 NOTE — Assessment & Plan Note (Addendum)
Chronic.  Prior work-up including ABI and DVT ultrasound negative.  Suspect venous insufficiency given venous stasis changes.  Advised to reschedule appointment with vascular specialist.  Continue to use compression stockings as able, advised to elevate legs and avoid sodium.  Recommended moisturizer for legs.

## 2021-11-30 NOTE — Assessment & Plan Note (Signed)
Mildly elevated, no changes today.  Consider medication adjustment if continues to be elevated.

## 2021-11-30 NOTE — Assessment & Plan Note (Signed)
>>  ASSESSMENT AND PLAN FOR LOCALIZED SWELLING OF RIGHT LOWER EXTREMITY WRITTEN ON 11/30/2021  2:31 PM BY AUSTIN ADE, MD  Chronic.  Prior work-up including ABI and DVT ultrasound negative.  Suspect venous insufficiency given venous stasis changes.  Advised to reschedule appointment with vascular specialist.  Continue to use compression stockings as able, advised to elevate legs and avoid sodium.  Recommended moisturizer for legs.

## 2021-12-14 ENCOUNTER — Other Ambulatory Visit: Payer: Self-pay

## 2021-12-14 ENCOUNTER — Encounter: Payer: Self-pay | Admitting: Family Medicine

## 2021-12-14 ENCOUNTER — Ambulatory Visit (INDEPENDENT_AMBULATORY_CARE_PROVIDER_SITE_OTHER): Payer: Self-pay | Admitting: Family Medicine

## 2021-12-14 VITALS — BP 127/101 | HR 80 | Ht 69.0 in | Wt 347.0 lb

## 2021-12-14 DIAGNOSIS — E1169 Type 2 diabetes mellitus with other specified complication: Secondary | ICD-10-CM

## 2021-12-14 DIAGNOSIS — I878 Other specified disorders of veins: Secondary | ICD-10-CM

## 2021-12-14 DIAGNOSIS — E785 Hyperlipidemia, unspecified: Secondary | ICD-10-CM

## 2021-12-14 DIAGNOSIS — I10 Essential (primary) hypertension: Secondary | ICD-10-CM

## 2021-12-14 DIAGNOSIS — E119 Type 2 diabetes mellitus without complications: Secondary | ICD-10-CM

## 2021-12-14 LAB — POCT GLYCOSYLATED HEMOGLOBIN (HGB A1C): HbA1c, POC (controlled diabetic range): 9.2 % — AB (ref 0.0–7.0)

## 2021-12-14 MED ORDER — ATORVASTATIN CALCIUM 40 MG PO TABS: 40.0000 mg | ORAL_TABLET | Freq: Every day | ORAL | 3 refills | Status: DC

## 2021-12-14 NOTE — Patient Instructions (Addendum)
Today at your annual preventive visit we talked about the following measures:   I recommend 150 minutes of exercise per week-try 30 minutes 5 days per week We discussed reducing sugary beverages (like soda and juice) and increasing leafy greens and whole fruits.  We discussed avoiding tobacco and alcohol.  I recommend avoiding illicit substances.  Your blood pressure is a little bit elevated today at 127/101.  I want you to check your blood pressures at home and keep a log of them and come back in the next few weeks.   When you go to the front, I want you to schedule an appointment with Dr. Nicholaus Bloom, our pharmacist who specializes with diabetic medication.  We will need to do close follow-up to make sure that we keep you well controlled.  Your A1c today was 9.2.  I am sending in the medication atorvastatin, please make sure to pick up and take this.  We are going to check your direct cholesterol level today.  I am putting in a referral for podiatry and vascular surgery referral as well to follow-up on your feet and your leg.  I recommend putting back on the compression hoses that will usually help with swelling as well

## 2021-12-14 NOTE — Assessment & Plan Note (Signed)
Patient with RLE swelling and hyperpigmentation that appears more consistent with chronic venous stasis.  Did consider DVT given more unilateral symptoms (also do note that patient has some appearance of milder changes on the left leg); patient has previously been worked up with a venous ultrasound in February 2022 that was negative.  Do not feel that repeat is necessary.  We will place another referral to vascular. - Vascular referral - Encouraged compression stockings/TED hose

## 2021-12-14 NOTE — Assessment & Plan Note (Signed)
BP elevated to 127/101.  Patient currently taking valsartan 80 mg daily.  Discussed importance of getting blood pressure cuff and checking measurements at home.  Instructed patient to complete a log for the next 2 to 4 weeks and follow-up in clinic. - BP log - Continue valsartan 80 mg daily

## 2021-12-14 NOTE — Assessment & Plan Note (Addendum)
A1c 9.2 today, improved from 11.1.  Patient is taking his metformin but was unable to afford Ozempic.  Patient is still not taking his atorvastatin. - Direct LDL today - Refilled atorvastatin - Continue metformin - Patient to follow-up with Dr. Tresa Endo in pharmacy clinic - Continued to encourage dietary changes - Orange card and financial aid assistance paperwork given to patient - Discussed ophthalmology exam

## 2021-12-14 NOTE — Progress Notes (Signed)
SUBJECTIVE:   CHIEF COMPLAINT / HPI:   Patient presents for a physical/follow-up.  He reports that his main concern is regarding his right lower leg swelling and color change.  He reports that has been progressively changing colors to a darker black for the last 2 years, the pain is worse when he is walking and he notes that he is swollen feet that is worse on the right.  Patient reports that he has been taking his metformin.  He has not taken his atorvastatin and does not believe he ever picked it up.  He attempted to go on the Ozempic but when he went to get the prescription it was $900 because he does not have any insurance.  He does feel like his sugars have probably been higher recently.  He is not sure if he has seen a nutritionist before.  He believes he will be able to have insurance soon for his job more.  Patient notes that his blood pressure machine at home has been broken for the last 1 to 2 months.  He does not member what the blood pressure measurements were previously.   PERTINENT  PMH / PSH: Reviewed  OBJECTIVE:   BP (!) 127/101    Pulse 80    Ht 5\' 9"  (1.753 m)    Wt (!) 347 lb (157.4 kg)    SpO2 99%    BMI 51.24 kg/m   General: NAD, well-appearing, well-nourished Respiratory: No respiratory distress, breathing comfortably, able to speak in full sentences Skin: warm and dry Psych: Appropriate affect and mood Extremities: RLE with hyperpigmentation of the lower extremity, no open wounds present, skin texture dry and somewhat hard to palpation.  LLE with some milder areas of chronic appearing changes with minimal hyperpigmentation.  Diabetic Foot Exam - Simple   Simple Foot Form Diabetic Foot exam was performed with the following findings: Yes 12/14/2021 12:21 PM  Visual Inspection No deformities, no ulcerations, no other skin breakdown bilaterally: Yes Sensation Testing Intact to touch and monofilament testing bilaterally: Yes Pulse Check Posterior Tibialis and  Dorsalis pulse intact bilaterally: Yes Comments No hair noted on foot. Does have areas of thickened skin and has signs of venous stasis.      ASSESSMENT/PLAN:   Venous stasis of lower extremity Patient with RLE swelling and hyperpigmentation that appears more consistent with chronic venous stasis.  Did consider DVT given more unilateral symptoms (also do note that patient has some appearance of milder changes on the left leg); patient has previously been worked up with a venous ultrasound in February 2022 that was negative.  Do not feel that repeat is necessary.  We will place another referral to vascular. - Vascular referral - Encouraged compression stockings/TED hose  Type 2 diabetes mellitus without complication, without long-term current use of insulin (HCC) A1c 9.2 today, improved from 11.1.  Patient is taking his metformin but was unable to afford Ozempic.  Patient is still not taking his atorvastatin. - Direct LDL today - Refilled atorvastatin - Continue metformin - Patient to follow-up with Dr. March 2022 in pharmacy clinic - Continued to encourage dietary changes - Orange card and financial aid assistance paperwork given to patient - Discussed ophthalmology exam  Hypertension BP elevated to 127/101.  Patient currently taking valsartan 80 mg daily.  Discussed importance of getting blood pressure cuff and checking measurements at home.  Instructed patient to complete a log for the next 2 to 4 weeks and follow-up in clinic. - BP log -  Continue valsartan 80 mg daily     Jaleah Lefevre, DO [facility]

## 2021-12-15 ENCOUNTER — Other Ambulatory Visit: Payer: Self-pay | Admitting: Family Medicine

## 2021-12-15 DIAGNOSIS — E875 Hyperkalemia: Secondary | ICD-10-CM

## 2021-12-15 LAB — LDL CHOLESTEROL, DIRECT: LDL Direct: 144 mg/dL — ABNORMAL HIGH (ref 0–99)

## 2021-12-15 LAB — BASIC METABOLIC PANEL
BUN/Creatinine Ratio: 10 (ref 9–20)
BUN: 10 mg/dL (ref 6–20)
CO2: 26 mmol/L (ref 20–29)
Calcium: 10.4 mg/dL — ABNORMAL HIGH (ref 8.7–10.2)
Chloride: 98 mmol/L (ref 96–106)
Creatinine, Ser: 0.96 mg/dL (ref 0.76–1.27)
Glucose: 251 mg/dL — ABNORMAL HIGH (ref 70–99)
Potassium: 5.5 mmol/L — ABNORMAL HIGH (ref 3.5–5.2)
Sodium: 135 mmol/L (ref 134–144)
eGFR: 109 mL/min/{1.73_m2} (ref >59)

## 2021-12-15 NOTE — Progress Notes (Signed)
Called patient with results as his potassium was elevated. Discussed with patient to come in for a repeat lab and we will check the result and see if we need to adjust his valsartan based on the new results. Patient expressed understanding and will try to get to the clinic this afternoon.   Lorenso Quirino, DO

## 2021-12-21 ENCOUNTER — Ambulatory Visit: Payer: Medicaid Other | Admitting: Podiatry

## 2021-12-28 ENCOUNTER — Other Ambulatory Visit: Payer: Medicaid Other

## 2022-01-09 ENCOUNTER — Other Ambulatory Visit: Payer: Self-pay

## 2022-01-09 DIAGNOSIS — R2241 Localized swelling, mass and lump, right lower limb: Secondary | ICD-10-CM

## 2022-01-09 NOTE — Progress Notes (Signed)
Error

## 2022-01-19 ENCOUNTER — Inpatient Hospital Stay (HOSPITAL_COMMUNITY): Admission: RE | Admit: 2022-01-19 | Payer: Self-pay | Source: Ambulatory Visit

## 2022-02-15 ENCOUNTER — Encounter (HOSPITAL_COMMUNITY): Payer: Medicaid Other

## 2022-02-15 ENCOUNTER — Encounter: Payer: Self-pay | Admitting: Vascular Surgery

## 2022-02-20 ENCOUNTER — Encounter: Payer: Self-pay | Admitting: Family Medicine

## 2022-03-03 ENCOUNTER — Other Ambulatory Visit: Payer: Self-pay

## 2022-03-03 ENCOUNTER — Encounter: Payer: Self-pay | Admitting: Family Medicine

## 2022-03-03 ENCOUNTER — Ambulatory Visit (INDEPENDENT_AMBULATORY_CARE_PROVIDER_SITE_OTHER): Payer: Self-pay | Admitting: Family Medicine

## 2022-03-03 VITALS — BP 147/95 | HR 93 | Ht 69.0 in | Wt 354.6 lb

## 2022-03-03 DIAGNOSIS — I878 Other specified disorders of veins: Secondary | ICD-10-CM

## 2022-03-03 DIAGNOSIS — Z599 Problem related to housing and economic circumstances, unspecified: Secondary | ICD-10-CM

## 2022-03-03 DIAGNOSIS — E119 Type 2 diabetes mellitus without complications: Secondary | ICD-10-CM

## 2022-03-03 DIAGNOSIS — E875 Hyperkalemia: Secondary | ICD-10-CM

## 2022-03-03 DIAGNOSIS — G4733 Obstructive sleep apnea (adult) (pediatric): Secondary | ICD-10-CM

## 2022-03-03 DIAGNOSIS — I10 Essential (primary) hypertension: Secondary | ICD-10-CM

## 2022-03-03 DIAGNOSIS — E1169 Type 2 diabetes mellitus with other specified complication: Secondary | ICD-10-CM

## 2022-03-03 DIAGNOSIS — E785 Hyperlipidemia, unspecified: Secondary | ICD-10-CM

## 2022-03-03 LAB — POCT GLYCOSYLATED HEMOGLOBIN (HGB A1C): HbA1c, POC (controlled diabetic range): 9.8 % — AB (ref 0.0–7.0)

## 2022-03-03 MED ORDER — OZEMPIC (0.25 OR 0.5 MG/DOSE) 2 MG/1.5ML ~~LOC~~ SOPN: 0.5000 mg | PEN_INJECTOR | SUBCUTANEOUS | 1 refills | Status: DC

## 2022-03-03 MED ORDER — ATORVASTATIN CALCIUM 40 MG PO TABS: 40.0000 mg | ORAL_TABLET | Freq: Every day | ORAL | 3 refills | Status: DC

## 2022-03-03 MED ORDER — AMLODIPINE BESYLATE 5 MG PO TABS: 5.0000 mg | ORAL_TABLET | Freq: Every day | ORAL | 3 refills | Status: DC

## 2022-03-03 NOTE — Patient Instructions (Addendum)
We are going to check your kidneys today with a lab. ? ?I am going to send in a medication called amlodipine that I want you to start for your blood pressure.  Based on your labs we may need to start your other medication of the valsartan again but I will call you with those results and let you know. ? ?Your A1c was elevated at 9.8, it would be important to jumpstart and get blood sugars under control.  And they do not want to start insulin at this time but if we cannot afford any other medications this may need to be something we consider.  I have resent in the Ozempic to try again and see if that will be covered by your insurance at this time. ? ?It will be very important for you to also start taking the statin medication, if you cannot afford this and please let me know so that I can send in a different medication to try. ? ?I would work with social work to try and see if we get your BiPAP machine.  This may take a little bit of work and time. ? ? ? ?This will be the time that is important for you to make sure you are staying on top of things and keep up with your medical conditions. ?

## 2022-03-03 NOTE — Progress Notes (Signed)
? ? ?SUBJECTIVE:  ? ?CHIEF COMPLAINT / HPI:  ? ?Patient reports that his leg looks darker than it previously has as it is progressively getting getting worse.  He does have pain but mainly when he is standing for long periods of time due to his job.  He states that he went to vascular surgery but was told that he needed to have a new referral placed. ? ?Patient reports he is not taking his atorvastatin or Ozempic due to the cost. ? ?Patient reports he has been to be on a BiPAP/CPAP machine but was not able to afford it at the time and is feeling that he needs to get it now. His wife has mentioned to him that he sometimes has loud and abnormal breath sounds when sitting up.  ? ?PERTINENT  PMH / PSH: Reviewed  ? ?OBJECTIVE:  ? ?BP (!) 147/95   Pulse 93   Ht 5\' 9"  (1.753 m)   Wt (!) 354 lb 9.6 oz (160.8 kg)   SpO2 98%   BMI 52.37 kg/m?   ?General: NAD, well-appearing, well-nourished ?Respiratory: No respiratory distress, breathing comfortably, able to speak in full sentences ?Skin: warm and dry, chronic hyperpigmentation of the  BLE (worse on the right) with palpable pulses and not TTP. ?Psych: Appropriate affect and mood ? ?ASSESSMENT/PLAN:  ? ?Obstructive sleep apnea ?Patient was not able to afford previous BiPap that was recommended by sleep specialist even with orange card. Would benefit from use significantly. Last sleep study appears to be in 2021. ?- Referral to care coordination for patient assistance ? ?Venous stasis of lower extremity ?Patient reports vascular surgery requested that he get another referral placed. Last referral was completed in Dec 2022. Discussed importance of patient following through with getting care. ?- Vascular referral placed again ?- Encouraged compression stockings again ? ?Hypertension ?BP elevated to 147/95, patient does not check it at home. Not currently using the Valsartan. Did not come back to the clinic to repeat the BMP after the last visit as requested. Given elevated  potassium at last visit, will repeat labs and determine if adding back valsartan is appropriate (preferrable given patient's diabetes) and will also start another BP medication.  ?- BMP today ?- Start amlodipine 5mg  daily ?- Will restart valsartan (likely at half dose) if BMP within normal limits ?- Encouraged patient to get BP cuff ? ?Type 2 diabetes mellitus without complication, without long-term current use of insulin (HCC) ?A1c elevated to 9.8, increased from 9.2 on last check in 12/22. Continues metformin, cannot afford Ozempic and did not ever start his Atorvastatin. Had a significant discussion on the importance of the medications and getting control of his diabetes.Patient has been unable to afford ozempic in the past, we will reattempt sending the medication and patient is to contact the office if he is not able to afford it so we can look at other options (pt wishes to avoid insulin). At this time, patient does not seem very motivated. ?- Refill of atorvastatin (pt to call the office if unable to afford) ?- Continue Metformin ?- Encouraging dietary changes ?- Patient given financial assistance paperwork at last visit ? ?Hyperlipidemia associated with type 2 diabetes mellitus (HCC) ?Patient not taking atorvastatin due to the cost. Discussed the importance of the lipid lowering therapy. Refill sent and patient to call the office if the cost is too high and we will try another medication.  ?  ?Financial difficulties ?Patient has difficulty with affording several treatment medications and supplies  for diabetes, hyperlipidemia, and OSA. Has been previously given orange card and financial assistance packet but unsure if patient followed through.  ?- Referral to care coordination placed for further assistance and management.  ? ?Fred Kornegay, DO ?[facility]  ?

## 2022-03-04 LAB — BASIC METABOLIC PANEL
BUN/Creatinine Ratio: 10 (ref 9–20)
BUN: 9 mg/dL (ref 6–20)
CO2: 24 mmol/L (ref 20–29)
Calcium: 10.9 mg/dL — ABNORMAL HIGH (ref 8.7–10.2)
Chloride: 98 mmol/L (ref 96–106)
Creatinine, Ser: 0.91 mg/dL (ref 0.76–1.27)
Glucose: 211 mg/dL — ABNORMAL HIGH (ref 70–99)
Potassium: 4.5 mmol/L (ref 3.5–5.2)
Sodium: 138 mmol/L (ref 134–144)
eGFR: 116 mL/min/{1.73_m2} (ref >59)

## 2022-03-04 NOTE — Assessment & Plan Note (Signed)
Patient not taking atorvastatin due to the cost. Discussed the importance of the lipid lowering therapy. Refill sent and patient to call the office if the cost is too high and we will try another medication.  ?

## 2022-03-04 NOTE — Assessment & Plan Note (Signed)
Patient was not able to afford previous BiPap that was recommended by sleep specialist even with orange card. Would benefit from use significantly. Last sleep study appears to be in 2021. ?- Referral to care coordination for patient assistance ?

## 2022-03-04 NOTE — Assessment & Plan Note (Signed)
BP elevated to 147/95, patient does not check it at home. Not currently using the Valsartan. Did not come back to the clinic to repeat the BMP after the last visit as requested. Given elevated potassium at last visit, will repeat labs and determine if adding back valsartan is appropriate (preferrable given patient's diabetes) and will also start another BP medication.  ?- BMP today ?- Start amlodipine 5mg  daily ?- Will restart valsartan (likely at half dose) if BMP within normal limits ?- Encouraged patient to get BP cuff ?

## 2022-03-04 NOTE — Assessment & Plan Note (Signed)
A1c elevated to 9.8, increased from 9.2 on last check in 12/22. Continues metformin, cannot afford Ozempic and did not ever start his Atorvastatin. Had a significant discussion on the importance of the medications and getting control of his diabetes.Patient has been unable to afford ozempic in the past, we will reattempt sending the medication and patient is to contact the office if he is not able to afford it so we can look at other options (pt wishes to avoid insulin). At this time, patient does not seem very motivated. ?- Refill of atorvastatin (pt to call the office if unable to afford) ?- Continue Metformin ?- Encouraging dietary changes ?- Patient given financial assistance paperwork at last visit ?

## 2022-03-04 NOTE — Assessment & Plan Note (Signed)
Patient reports vascular surgery requested that he get another referral placed. Last referral was completed in Dec 2022. Discussed importance of patient following through with getting care. ?- Vascular referral placed again ?- Encouraged compression stockings again ?

## 2022-03-07 ENCOUNTER — Telehealth: Payer: Self-pay | Admitting: *Deleted

## 2022-03-07 NOTE — Chronic Care Management (AMB) (Signed)
?  Care Management  ? ?Outreach Note ? ?03/07/2022 ?Name: Jacorion Klem MRN: [id number] DOB: 1991/03/25 ? ?Referred by: Evelena Leyden, DO ?Reason for referral : Care Coordination (Initial outreach to schedule referral with BSW) ? ? ?An unsuccessful telephone outreach was attempted today. The patient was referred to the case management team for assistance with care management and care coordination.  ? ?Follow Up Plan:  ?A HIPAA compliant phone message was left for the patient providing contact information and requesting a return call.  ?The care management team will reach out to the patient again over the next 7 days. If patient returns call to provider office, please advise to call Embedded Care Management Care Guide Misty Stanley at [phone]. ? ?Gwenevere Ghazi  ?Care Guide, Embedded Care Coordination ?[place]  Care Management  ?Direct Dial: [phone] ? ?

## 2022-03-08 ENCOUNTER — Telehealth: Payer: Self-pay | Admitting: Family Medicine

## 2022-03-08 NOTE — Telephone Encounter (Signed)
Called patient regarding medications previously prescribed. Patient reports he is able to get the atorvastatin, but would not be able to also afford the amlodipine. Patient is mainly available for appointments on Wednesdays, and my schedule is limited in the coming month. I will discuss with pharmacy team and see if he would be able to be scheduled with them to determine a good regimen for treatment that would also be affordable for the patient.  ? ? ?Fred Guitron, DO  ?

## 2022-03-14 NOTE — Chronic Care Management (AMB) (Signed)
?  Care Management  ? ?Outreach Note ? ?03/14/2022 ?Name: Fred Reyes MRN: [id number] DOB: 04-11-1991 ? ?Referred by: Evelena Leyden, DO ?Reason for referral : Care Coordination (Initial outreach to schedule referral with BSW) ? ? ?A second unsuccessful telephone outreach was attempted today. The patient was referred to the case management team for assistance with care management and care coordination.  ? ?Follow Up Plan:  ?A HIPAA compliant phone message was left for the patient providing contact information and requesting a return call.  ?The care management team will reach out to the patient again over the next 7 days.  ?If patient returns call to provider office, please advise to call Embedded Care Management Care Guide Misty Stanley* at [phone].* ? ?Gwenevere Ghazi  ?Care Guide, Embedded Care Coordination ?  Care Management  ?Direct Dial: [phone] ? ?

## 2022-03-21 NOTE — Chronic Care Management (AMB) (Signed)
?  Care Management  ? ?Note ? ?03/21/2022 ?Name: Fred Reyes MRN: [id number] DOB: 1991-02-11 ? ?Fred Reyes is a 31 y.o. year old male who is a primary care patient of Lilland, Alana, DO. I reached out to [employer] by phone today offer care coordination services.  ? ?Fred Reyes was given information about care management services today including:  ?Care management services include personalized support from designated clinical staff supervised by his physician, including individualized plan of care and coordination with other care providers ?24/7 contact phone numbers for assistance for urgent and routine care needs. ?The patient may stop care management services at any time by phone call to the office staff. ? ?Patient agreed to services and verbal consent obtained.  ? ?Follow up plan: ?Telephone appointment with care management team member scheduled for:03/28/22 ? ?Gwenevere Ghazi  ?Care Guide, Embedded Care Coordination ?[place]  Care Management  ?Direct Dial: [phone] ? ?

## 2022-03-22 ENCOUNTER — Other Ambulatory Visit (HOSPITAL_COMMUNITY): Payer: Self-pay

## 2022-03-28 ENCOUNTER — Telehealth: Payer: Medicaid Other

## 2022-03-29 ENCOUNTER — Telehealth: Payer: Self-pay | Admitting: Licensed Clinical Social Worker

## 2022-03-29 NOTE — Telephone Encounter (Signed)
?  Care Management  ? ?Follow Up Note ? ? ?03/29/2022 ?Name: Fred Reyes MRN: [id number] DOB: 27-Oct-1991 ? ? ?Referred by: Rise Patience, DO ?Reason for referral : No chief complaint on file. ? ? ?A second unsuccessful telephone outreach was attempted today. The patient was referred to the case management team for assistance with care management and care coordination.  ? ?Follow Up Plan: The care management team will reach out to the patient again over the next 30 days.  ? ?Milus Height, BSW  ?Social Worker ?[facility] Care Management  ?[phone] ?  ?

## 2022-03-29 NOTE — Telephone Encounter (Signed)
?  Care Management  ? ?Follow Up Note ? ? ?03/28/2022 ? ?Name: Fred Reyes MRN: [id number] DOB: 05-Feb-1991 ? ? ?Referred by: Evelena Leyden, DO ?Reason for referral : No chief complaint on file. ? ? ?An unsuccessful telephone outreach was attempted today. The patient was referred to the case management team for assistance with care management and care coordination.  ? ?Follow Up Plan: The care management team will reach out to the patient again over the next 7 days.  ? ?Christen Butter, BSW  ?Social Worker ?[facility] Care Management  ?[phone] ?  ?

## 2022-04-04 ENCOUNTER — Telehealth: Payer: Self-pay | Admitting: Pharmacist

## 2022-04-04 NOTE — Telephone Encounter (Signed)
-----   Message from Evelena Leyden, DO sent at 03/08/2022 11:40 AM EDT ----- ?Regarding: Patient assistance ?Hey Dr. Raymondo Band, ? ?I have this patient, who is being followed for several issues, but most recently am dealing with his HTN, DM and cholesterol. He has been having issues with affording medications and I am not available on Wednesdays (his best day for appointments) for the next month. Do you think you would be able to set up an appointment with him to see you and assist him with getting medications he is able to afford? If not, just let me know and I will do what I can to get him with someone else.  ? ?Thanks, ?Alana Lilland  ? ?

## 2022-04-04 NOTE — Telephone Encounter (Signed)
Patient contacted for med review and assistance.  ? ?Scheduled appointment tomorrow 4/19 at 9:00 AM ?Patient asked to bring all medications.  ? ?Currently has Medicaid "Family Planning" ? ?We briefly discussed medications costs and he stated he had filled out application for "orange card" status.  ? ?I will invite Milus Height, CSW to join visit if she is available tomorrow AM.  ?

## 2022-04-05 ENCOUNTER — Ambulatory Visit: Payer: Medicaid Other | Admitting: Pharmacist

## 2022-04-06 NOTE — Telephone Encounter (Signed)
Noted and agree. 

## 2022-04-26 ENCOUNTER — Telehealth: Payer: Medicaid Other

## 2022-05-08 ENCOUNTER — Telehealth: Payer: Self-pay | Admitting: *Deleted

## 2022-05-08 NOTE — Chronic Care Management (AMB) (Signed)
  Care Coordination Note  05/08/2022 Name: Fred Reyes MRN: [id number] DOB: July 28, 1991  Fred Reyes is a 31 y.o. year old male who is a primary care patient of Lilland, Alana, DO and is actively engaged with the care management team. I reached out to [employer] by phone today to assist with re-scheduling an initial visit with the BSW  Follow up plan: Unsuccessful telephone outreach attempt made. A HIPAA compliant phone message was left for the patient providing contact information and requesting a return call.  The care management team will reach out to the patient again over the next 7 days.  If patient returns call to provider office, please advise to call Embedded Care Management Care Guide Fred Reyes  at [phone].  Fred Reyes  Care Guide, Embedded Care Coordination [facility] Management  Direct Dial: [phone]

## 2022-05-08 NOTE — Chronic Care Management (AMB) (Signed)
  Care Coordination Note  05/08/2022 Name: Fred Reyes MRN: [id number] DOB: July 30, 1991  Fred Reyes is a 31 y.o. year old male who is a primary care patient of Lilland, Alana, DO and is actively engaged with the care management team. I reached out to Fred Reyes by phone today to assist with re-scheduling an initial visit with the BSW  Follow up plan: Telephone appointment with care management team member scheduled for:06/06/22  [facility] Guide, Embedded Care Coordination [facility]  Care Management  Direct Dial: [phone]

## 2022-05-23 ENCOUNTER — Other Ambulatory Visit: Payer: Self-pay | Admitting: *Deleted

## 2022-05-23 ENCOUNTER — Encounter: Payer: Self-pay | Admitting: *Deleted

## 2022-05-23 DIAGNOSIS — R2241 Localized swelling, mass and lump, right lower limb: Secondary | ICD-10-CM

## 2022-05-28 ENCOUNTER — Ambulatory Visit (HOSPITAL_COMMUNITY)
Admission: EM | Admit: 2022-05-28 | Discharge: 2022-05-28 | Disposition: A | Discharge status: Home / Self Care | Payer: Medicaid Other

## 2022-05-28 ENCOUNTER — Encounter (HOSPITAL_COMMUNITY): Payer: Self-pay

## 2022-05-28 DIAGNOSIS — S21232A Puncture wound without foreign body of left back wall of thorax without penetration into thoracic cavity, initial encounter: Secondary | ICD-10-CM

## 2022-05-28 DIAGNOSIS — W57XXXA Bitten or stung by nonvenomous insect and other nonvenomous arthropods, initial encounter: Secondary | ICD-10-CM

## 2022-05-28 NOTE — Discharge Instructions (Signed)
Tick bite was removed in its entirety  There is low risk for Lyme disease or [facility] spotted fever in this area, you will only need blood work if you begin to have symptoms such as below, please watch area for a bull's-eye rash  Chills and fever. Headache. Fatigue. General achiness. Muscle pain. Joint pain, often in the knees. A round, red rash that surrounds the center of the tick bite. The center of the rash may be blood colored or have tiny blisters. Swollen lymph glands. Stiff neck.  It is possible that you may have a mild skin irritation due to the bite mark will appear to have some mild redness and itching, if this occurs you may use over-the-counter hydrocortisone cream twice daily to the affected area  As there is now an opening in your skin please watch for general infection such as increased swelling, increased redness, puslike drainage, fever or chills, if this occurs at any point please return to urgent care or follow-up with your primary doctor for evaluation

## 2022-05-28 NOTE — ED Triage Notes (Signed)
Patient presents to Urgent Care with complaints of tick in pace since earlier this week and he originally thought it was a bug bite since he could only feel a lump on his lower back. Today pt wife looked at it and realized it was a tick. Patient reports he was scared to remove it at home due to diabetes and inability to see it.

## 2022-05-28 NOTE — ED Provider Notes (Signed)
[facility]    CSN: [id number] Arrival date & time: 05/28/22  1558      History   Chief Complaint Chief Complaint  Patient presents with   Tick Removal    HPI Fred Reyes is a 31 y.o. male.   Presents with tick to the left lower back noticed today.  The timeframe to take it has been present for has not attempted to remove, concerned about home removal due to history of diabetes.  Endorses that he took his child to the park within the last few days, also [place].  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   Type 2 diabetes mellitus without complication, without long-term current use of insulin (HCC) 04/16/2019    Patient Active Problem List   Diagnosis Date Noted   Venous stasis of lower extremity 12/14/2021   Pain and swelling of right lower leg 08/04/2021   Localized swelling of right lower extremity 08/13/2020   Obstructive sleep apnea 03/26/2020   Excessive daytime sleepiness 11/07/2019   Bilateral foot pain 07/09/2019   LVH (left ventricular hypertrophy) 06/12/2019   Hyperlipidemia associated with type 2 diabetes mellitus (HCC) 06/12/2019   Morbid obesity (HCC) 04/16/2019   Hypertension 04/16/2019   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 Authorizing Provider  amLODipine (NORVASC) 5 MG tablet Take 1 tablet (5 mg total) by mouth  at bedtime. 03/03/22   Lilland, Alana, DO  atorvastatin (LIPITOR) 40 MG tablet Take 1 tablet (40 mg total) by mouth daily. 03/03/22   Lilland, Alana, DO  metFORMIN (GLUCOPHAGE) 500 MG tablet Take 1 tablet (500 mg total) by mouth 2 (two) times daily with a meal. 08/04/21   Lilland, Alana, DO  Semaglutide,0.25 or 0.5MG /DOS, (OZEMPIC, 0.25 OR 0.5 MG/DOSE,) 2 MG/1.5ML SOPN Inject 0.5 mg into the skin once a week. Patient not taking: Reported on 04/04/2022 03/03/22   Lilland, Alana, DO  valsartan (DIOVAN) 80 MG tablet Take 1 tablet (80 mg total) by mouth at bedtime. 08/04/21   Evelena Leyden, DO    Family History Family History  Adopted: Yes  Family history unknown: Yes    Social History Social History   Tobacco Use   Smoking status: Former    Packs/day: 0.50    Years: 2.00    Total pack years: 1.00    Types: Cigarettes   Smokeless tobacco: Never  Vaping Use   Vaping Use: Former  Substance Use Topics   Alcohol use: Yes    Alcohol/week: 7.0 standard drinks of alcohol    Types: 7 Cans of beer per week   Drug use: Not Currently    Types: Marijuana, Cocaine    Comment: no MJ or cocaine use in about 2 years. previously used cocaine about once/month     Allergies   Patient has no known allergies.  Review of Systems Review of Systems  Constitutional: Negative.   Eyes: Negative.   Respiratory: Negative.    Cardiovascular: Negative.   Musculoskeletal: Negative.   Neurological: Negative.      Physical Exam Triage Vital Signs ED Triage Vitals  Enc Vitals Group     BP 05/28/22 1621 140/80     Pulse Rate 05/28/22 1621 84     Resp 05/28/22 1621 15     Temp 05/28/22 1621 (!) 97.5 F (36.4 C)     Temp src --      SpO2 05/28/22 1621 100 %     Weight --      Height --      Head Circumference --      Peak Flow --      Pain Score 05/28/22 1618 0     Pain Loc --      Pain Edu? --      Excl. in GC? --    No data found.  Updated Vital Signs BP 140/80   Pulse 84   Temp (!)  97.5 F (36.4 C)   Resp 15   SpO2 100%   Visual Acuity Right Eye Distance:   Left Eye Distance:   Bilateral Distance:    Right Eye Near:   Left Eye Near:    Bilateral Near:     Physical Exam Constitutional:      Appearance: Normal appearance.  HENT:     Head: Normocephalic.  Eyes:     Extraocular Movements: Extraocular movements intact.  Pulmonary:     Effort: Pulmonary effort is normal.  Musculoskeletal:       Back:     Comments: Tick present to the left lower lumbar region  Neurological:     Mental Status: He is alert and oriented to person, place, and time. Mental status is at baseline.  Psychiatric:        Mood and Affect: Mood normal.        Behavior: Behavior normal.      UC Treatments / Results  Labs (all labs ordered are listed, but only abnormal results are displayed) Labs Reviewed - No data to display  EKG   Radiology No results found.  Procedures Procedures (including critical care time)  Medications Ordered in UC Medications - No data to display  Initial Impression / Assessment and Plan / UC Course  I have reviewed the triage vital signs and the nursing notes.  Pertinent labs & imaging results that were available during my care of the patient were reviewed by me and considered in my medical decision making (see chart for details).  Tick bite with subsequent removal of tick  Tick removed in its entirety using a pair of tweezers, site has scant bleeding, Band-Aid applied, patient given precautions for signs of Lyme disease and discussed when to follow-up, patient given signs for general infection and when to follow-up Final Clinical Impressions(s) / UC Diagnoses   Final diagnoses:  None   Discharge Instructions   None    ED Prescriptions   None    PDMP not reviewed this encounter.   Valinda Hoar, NP 05/28/22 1701

## 2022-05-31 ENCOUNTER — Ambulatory Visit (INDEPENDENT_AMBULATORY_CARE_PROVIDER_SITE_OTHER): Payer: Self-pay | Admitting: Vascular Surgery

## 2022-05-31 ENCOUNTER — Encounter: Payer: Self-pay | Admitting: Vascular Surgery

## 2022-05-31 ENCOUNTER — Ambulatory Visit (HOSPITAL_COMMUNITY)
Admission: RE | Admit: 2022-05-31 | Discharge: 2022-05-31 | Disposition: A | Discharge status: Home / Self Care | Payer: Medicaid Other | Source: Ambulatory Visit | Attending: Vascular Surgery | Admitting: Vascular Surgery

## 2022-05-31 VITALS — BP 117/72 | HR 94 | Temp 98.3°F | Resp 20 | Ht 69.0 in | Wt 349.4 lb

## 2022-05-31 DIAGNOSIS — R2241 Localized swelling, mass and lump, right lower limb: Secondary | ICD-10-CM

## 2022-05-31 NOTE — Progress Notes (Signed)
Patient ID: Fred Reyes, male   DOB: [birth year], 31 y.o.   MRN: [id number]  Reason for Consult: New Patient (Initial Visit)   Referred by Evelena LeydenLilland, Alana, DO  Subjective:     HPI:  Fred Reyes is a 31 y.o. male without any previous history of vascular disease presents for right lower extremity swelling with skin discoloration.  He denies any previous lower extremity procedures denies any personal or family history of DVT.  He has worn compression stockings in the past but not recently.  States that he has gained weight in the past couple years nothing acutely and that his weight more recently has been stable.  He does not have any ulcerations.  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   Type 2 diabetes mellitus without complication, without long-term current use of insulin (HCC) 04/16/2019   Family History  Adopted: Yes  Family history unknown: Yes   Past Surgical History:  Procedure Laterality Date   NO PAST SURGERIES      Short Social History:  Social History   Tobacco Use   Smoking status: Former    Packs/day: 0.50    Years: 2.00    Total pack years: 1.00    Types: Cigarettes   Smokeless tobacco: Never  Substance Use Topics   Alcohol use: Yes    Alcohol/week: 7.0 standard drinks of alcohol    Types: 7 Cans of beer per week    No Known Allergies  Current Outpatient Medications  Medication Sig Dispense Refill   amLODipine (NORVASC) 5 MG tablet Take 1 tablet (5 mg total) by mouth at bedtime. 90 tablet 3   atorvastatin (LIPITOR) 40 MG tablet Take 1 tablet (40 mg total) by mouth daily. 90 tablet 3   metFORMIN (GLUCOPHAGE) 500 MG tablet Take 1 tablet  (500 mg total) by mouth 2 (two) times daily with a meal. 60 tablet 3   valsartan (DIOVAN) 80 MG tablet Take 1 tablet (80 mg total) by mouth at bedtime. 90 tablet 3   Semaglutide,0.25 or 0.5MG /DOS, (OZEMPIC, 0.25 OR 0.5 MG/DOSE,) 2 MG/1.5ML SOPN Inject 0.5 mg into the skin once a week. (Patient not taking: Reported on 04/04/2022) 1.5 mL 1   No current facility-administered medications for this visit.    Review of Systems  HENT: HENT negative.  Eyes: Eyes negative.  Cardiovascular: Positive for leg swelling.  GI: Gastrointestinal negative.  Skin:       Thick dark skin right ankle Neurological: Neurological negative. Hematologic: Hematologic/lymphatic negative.  Psychiatric: Psychiatric negative.        Objective:  Objective   Vitals:   05/31/22 1506  BP: 117/72  Pulse: 94  Resp: 20  Temp: 98.3 F (36.8 C)  SpO2: 93%  Weight: (!) 349 lb 6.4 oz (158.5 kg)  Height: 5\' 9"  (1.753 m)   Body mass index is 51.6 kg/m.  Physical Exam HENT:     Head: Normocephalic.     Nose: Nose normal.  Eyes:     Pupils: Pupils are equal, round, and reactive to light.  Cardiovascular:     Rate and Rhythm: Normal rate.     Pulses: Normal pulses.  Pulmonary:     Effort: Pulmonary effort is normal.  Abdominal:     General: Abdomen is flat.     Palpations: Abdomen is soft.  Musculoskeletal:     Cervical back: Normal range of motion and neck supple.  Skin:    General: Skin is warm.     Capillary Refill: Capillary refill takes less than 2 seconds.     Comments: Right medial leg with lipodermatosclerosis  Neurological:     General: No focal deficit present.     Mental Status: He is alert.  Psychiatric:        Mood and Affect: Mood normal.     Data: Venous Reflux Times  +--------------+---------+------+-----------+------------+--------+  RIGHT         Reflux NoRefluxReflux TimeDiameter cmsComments                          Yes                                    +--------------+---------+------+-----------+------------+--------+  CFV                     yes   >1 second                       +--------------+---------+------+-----------+------------+--------+  FV mid        no                                              +--------------+---------+------+-----------+------------+--------+  Popliteal     no                                              +--------------+---------+------+-----------+------------+--------+  GSV at [facility]    no                            0.70              +--------------+---------+------+-----------+------------+--------+  GSV prox thighno                            0.37              +--------------+---------+------+-----------+------------+--------+  GSV mid thigh no                            0.33              +--------------+---------+------+-----------+------------+--------+  GSV dist thighno                            0.37              +--------------+---------+------+-----------+------------+--------+  GSV at knee   no                            0.36              +--------------+---------+------+-----------+------------+--------+  GSV prox calf no  0.35              +--------------+---------+------+-----------+------------+--------+  GSV mid calf  no                            0.32              +--------------+---------+------+-----------+------------+--------+  GSV dist calf no                            0.32              +--------------+---------+------+-----------+------------+--------+  SSV Pop Fossa no                            0.66              +--------------+---------+------+-----------+------------+--------+  SSV prox calf no                            0.34              +--------------+---------+------+-----------+------------+--------+  SSV mid calf  no                            0.36               +--------------+---------+------+-----------+------------+--------+  AASV o area             yes    >500 ms      0.52              +--------------+---------+------+-----------+------------+--------+  AASV p        no                            0.46              +--------------+---------+------+-----------+------------+--------+  AASV mid                yes    >500 ms              branches  +--------------+---------+------+-----------+------------+--------+          Summary:  Right:  - No evidence of deep vein thrombosis seen in the right lower extremity,  from the common femoral through the popliteal veins.  - No evidence of superficial venous reflux seen in the right greater  saphenous vein.  - No evidence of superficial venous reflux seen in the right short  saphenous vein.  - Venous reflux is noted in the right common femoral vein.   - Venous reflux is noted in the AASV origin and mid segment.      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  I evaluated his anterior sensory vein myself at the bedside today and this does not appear to be amenable to laser ablation.  I recommended knee-high compression socks as I do not think he would be able to fit in thigh-high stockings.  He states that he has some of these to go back to wearing them.  I also discussed the need for weight loss given his truncal obesity and its role in deep venous reflux.  He demonstrates good understanding can see me on an as-needed basis.     Dennard Schaumann  Donzetta Matters MD [facility]

## 2022-06-06 ENCOUNTER — Telehealth: Payer: Medicaid Other

## 2022-06-06 ENCOUNTER — Telehealth: Payer: Self-pay | Admitting: Licensed Clinical Social Worker

## 2022-06-06 NOTE — Telephone Encounter (Signed)
  Care Management   Follow Up Note   06/06/2022 Name: Fred Reyes MRN: [id number] DOB: 1991-01-07   Referred by: Evelena Leyden, DO Reason for referral : No chief complaint on file.   An unsuccessful telephone outreach was attempted today. The patient was referred to the case management team for assistance with care management and care coordination.   Follow Up Plan: The care management team will reach out to the patient again over the next 30 days.   Ander Gaster , MSW Social Worker [facility]  [phone]

## 2022-08-07 ENCOUNTER — Other Ambulatory Visit: Payer: Self-pay | Admitting: Family Medicine

## 2022-10-27 ENCOUNTER — Encounter: Payer: Self-pay | Admitting: Family Medicine

## 2022-11-01 ENCOUNTER — Ambulatory Visit: Payer: Medicaid Other | Admitting: Student

## 2022-11-01 NOTE — Progress Notes (Deleted)
  SUBJECTIVE:   CHIEF COMPLAINT / HPI:   ***  PERTINENT  PMH / PSH: anxiety, asthma, depression, HTN, obesity, mitral valve degeneration? DM2    OBJECTIVE:  There were no vitals taken for this visit. Physical Exam   ASSESSMENT/PLAN:  There are no diagnoses linked to this encounter. No follow-ups on file. Alfredo Martinez, MD 11/01/2022, 7:47 AM PGY-2, [facility] {    This will disappear when note is signed, click to select method of visit    :1}

## 2022-11-01 NOTE — Patient Instructions (Incomplete)
It was great to see you today! Thank you for choosing [facility] for your primary care. Fred Reyes was seen for follow up.  If you haven't already, sign up for My Chart to have easy access to your labs results, and communication with your primary care physician.  We are checking some labs today. If they are abnormal, I will call you. If they are normal, I will send you a MyChart message (if it is active) or a letter in the mail. If you do not hear about your labs in the next 2 weeks, please call the office. I recommend that you always bring your medications to each appointment as this makes it easy to ensure you are on the correct medications and helps [place] not miss refills when you need them. Call the clinic at [phone] if your symptoms worsen or you have any concerns.  You should return to our clinic No follow-ups on file. Please arrive 15 minutes before your appointment to ensure smooth check in process.  We appreciate your efforts in making this happen.  Thank you for allowing me to participate in your care, Alfredo Martinez, MD 11/01/2022, 7:47 AM PGY-2, [facility]

## 2022-12-01 ENCOUNTER — Ambulatory Visit: Payer: Self-pay | Admitting: Licensed Clinical Social Worker

## 2022-12-01 NOTE — Patient Outreach (Signed)
SW removed from care team.  Kathleene Bergemann, BSW , MSW, LCSW-A Social Worker [facility]  [phone]   

## 2022-12-20 ENCOUNTER — Encounter: Payer: Self-pay | Admitting: Family Medicine

## 2022-12-20 ENCOUNTER — Ambulatory Visit (INDEPENDENT_AMBULATORY_CARE_PROVIDER_SITE_OTHER): Payer: BLUE CROSS/BLUE SHIELD | Admitting: Family Medicine

## 2022-12-20 VITALS — BP 153/94 | HR 96 | Ht 67.0 in | Wt 348.2 lb

## 2022-12-20 DIAGNOSIS — M793 Panniculitis, unspecified: Secondary | ICD-10-CM | POA: Diagnosis not present

## 2022-12-20 DIAGNOSIS — I10 Essential (primary) hypertension: Secondary | ICD-10-CM | POA: Diagnosis not present

## 2022-12-20 DIAGNOSIS — E119 Type 2 diabetes mellitus without complications: Secondary | ICD-10-CM | POA: Diagnosis not present

## 2022-12-20 LAB — POCT GLYCOSYLATED HEMOGLOBIN (HGB A1C): HbA1c, POC (controlled diabetic range): 10.4 % — AB (ref 0.0–7.0)

## 2022-12-20 MED ORDER — METFORMIN HCL ER 750 MG PO TB24: 750.0000 mg | ORAL_TABLET | Freq: Two times a day (BID) | ORAL | 0 refills | Status: DC

## 2022-12-20 MED ORDER — LISINOPRIL 10 MG PO TABS: 10.0000 mg | ORAL_TABLET | Freq: Every day | ORAL | 0 refills | Status: DC

## 2022-12-20 NOTE — Assessment & Plan Note (Signed)
Patient unable to afford his prior medications. Patient does have new insurance but since unclear the cost to patient on the new formulary, we will start with the [facility] affordable medications. - Start Lisinopril 10mg  daily  - Educated on hypotensive symptoms and return precautions - BMP in 1 week to monitor kidney function and potassium

## 2022-12-20 NOTE — Assessment & Plan Note (Signed)
Has been evaluated by vascular surgery and noted not to have much vascular reflux, which is reassuring. There is not much we are able to do at this time other than manage medical conditions and assist with weight loss.  - Vascular follow up per patient - Continue wearing compression socks per vascular recommendations

## 2022-12-20 NOTE — Progress Notes (Signed)
    SUBJECTIVE:   CHIEF COMPLAINT / HPI:   Max of $30 per month for meds, only taking Metformin which is $4   T2DM - Checking BG at home: no - Medications: metformin - Compliance: unable to afford ozempic - eye exam: unable to get done - foot exam: completing today - microalbumin: getting today - denies symptoms of hypoglycemia, polyuria, polydipsia, numbness extremities, foot ulcers/trauma  HTN: - Medications: none (was prescribed valsartan and amlodipine) - Compliance: not taking meds due to cost - Checking BP at home: no - Has been having headaches - Denies any SOB, CP, vision changes, LE edema, medication SEs, or symptoms of hypotension  Leg Hyperpigmentation  - Has been present for some time - Feels like getting darker and starting to spread - Already referred to vascular surgery and seen in 05/2022 - Was recommended for patient to continue knee-high compression socks - Has been wearing the compression socks   PERTINENT  PMH / PSH: Reviewed   OBJECTIVE:   BP (!) 153/94   Pulse 96   Ht 5\' 7"  (1.702 m)   Wt (!) 348 lb 4 oz (158 kg)   SpO2 97%   BMI 54.54 kg/m   General: NAD, well-appearing, well-nourished Respiratory: No respiratory distress, breathing comfortably, able to speak in full sentences Skin: warm and dry, no rashes noted on exposed skin. Hyperpigmentation noted on the right lower extremity  Psych: Appropriate affect and mood Diabetic Foot Exam - Simple   Simple Foot Form Diabetic Foot exam was performed with the following findings: Yes 12/20/2022  8:45 AM  Visual Inspection No deformities, no ulcerations, no other skin breakdown bilaterally: Yes Sensation Testing Intact to touch and monofilament testing bilaterally: Yes Pulse Check Posterior Tibialis and Dorsalis pulse intact bilaterally: Yes Comments Hyperpigmentation of the right lower leg noted and dry flaking skin bilaterally     ASSESSMENT/PLAN:   Type 2 diabetes mellitus without  complication, without long-term current use of insulin (HCC) A1c continues to trend upwards, patient unable to afford the Ozempic at this time and is on Metformin.  - Increase metformin to 750mg  daily then titrate to twice daily - Will work with pharmacy for assistance, patient's insurance has changed (hopeful to be able to afford GLP-1) - Urine microalbumin today - foot exam done today - Patient encouraged to get eye exam  Hypertension Patient unable to afford his prior medications. Patient does have new insurance but since unclear the cost to patient on the new formulary, we will start with the [facility] affordable medications. - Start Lisinopril 10mg  daily  - Educated on hypotensive symptoms and return precautions - BMP in 1 week to monitor kidney function and potassium  Lipodermatosclerosis of right lower extremity Has been evaluated by vascular surgery and noted not to have much vascular reflux, which is reassuring. There is not much we are able to do at this time other than manage medical conditions and assist with weight loss.  - Vascular follow up per patient - Continue wearing compression socks per vascular recommendations     Rise Patience, [place]

## 2022-12-20 NOTE — Assessment & Plan Note (Addendum)
A1c continues to trend upwards, patient unable to afford the Ozempic at this time and is on Metformin.  - Increase metformin to 750mg  daily then titrate to twice daily - Will work with pharmacy for assistance, patient's insurance has changed (hopeful to be able to afford GLP-1) - Urine microalbumin today - foot exam done today - Patient encouraged to get eye exam

## 2022-12-20 NOTE — Patient Instructions (Signed)
I am sending in a new blood pressure medication called Lisinopril. Make sure you keep an eye on symptoms such as dizziness, fatigue, unbalanced, and fatigue can be signs of low blood pressure.  I am increasing your Metformin dose, after a few days I want you to start taking it twice daily if you can tolerate it.  Make sure to come back in 1 week after starting the medications and get your blood work checked at our office.   I will be in touch regarding pharmacy assistance for medication costs.

## 2022-12-21 ENCOUNTER — Telehealth: Payer: Self-pay

## 2022-12-21 ENCOUNTER — Other Ambulatory Visit (HOSPITAL_COMMUNITY): Payer: Self-pay

## 2022-12-21 LAB — MICROALBUMIN / CREATININE URINE RATIO
Creatinine, Urine: 138.3 mg/dL
Microalb/Creat Ratio: 32 mg/g creat — ABNORMAL HIGH (ref 0–29)
Microalbumin, Urine: 44.8 ug/mL

## 2022-12-21 NOTE — Telephone Encounter (Signed)
A Prior Authorization was initiated for this patients OZEMPIC through CoverMyMeds.   Key: [id number]

## 2022-12-22 NOTE — Telephone Encounter (Signed)
Prior Auth for patients medication OZMEPIC approved by [employer] from 12/21/22 to 12/20/23.  Key: [id number]

## 2022-12-27 ENCOUNTER — Ambulatory Visit: Payer: Medicaid Other | Admitting: Family Medicine

## 2023-02-06 ENCOUNTER — Ambulatory Visit: Payer: BLUE CROSS/BLUE SHIELD | Admitting: Podiatry

## 2023-02-26 ENCOUNTER — Ambulatory Visit (INDEPENDENT_AMBULATORY_CARE_PROVIDER_SITE_OTHER): Payer: BLUE CROSS/BLUE SHIELD | Admitting: Podiatry

## 2023-02-26 DIAGNOSIS — Z91199 Patient's noncompliance with other medical treatment and regimen due to unspecified reason: Secondary | ICD-10-CM

## 2023-02-26 NOTE — Progress Notes (Signed)
No show

## 2023-03-02 ENCOUNTER — Ambulatory Visit: Payer: BLUE CROSS/BLUE SHIELD | Admitting: Family Medicine

## 2023-05-17 ENCOUNTER — Telehealth: Payer: BLUE CROSS/BLUE SHIELD

## 2023-07-17 ENCOUNTER — Ambulatory Visit: Payer: Medicaid Other | Admitting: Family Medicine

## 2023-08-21 ENCOUNTER — Encounter: Payer: Self-pay | Admitting: Pharmacist

## 2023-08-23 ENCOUNTER — Encounter: Payer: Self-pay | Admitting: Family Medicine

## 2023-08-23 ENCOUNTER — Other Ambulatory Visit: Payer: Self-pay

## 2023-08-23 ENCOUNTER — Ambulatory Visit (INDEPENDENT_AMBULATORY_CARE_PROVIDER_SITE_OTHER): Payer: BLUE CROSS/BLUE SHIELD | Admitting: Family Medicine

## 2023-08-23 VITALS — BP 134/83 | HR 90 | Ht 67.0 in | Wt 349.2 lb

## 2023-08-23 DIAGNOSIS — L602 Onychogryphosis: Secondary | ICD-10-CM | POA: Insufficient documentation

## 2023-08-23 DIAGNOSIS — I10 Essential (primary) hypertension: Secondary | ICD-10-CM

## 2023-08-23 DIAGNOSIS — M793 Panniculitis, unspecified: Secondary | ICD-10-CM

## 2023-08-23 DIAGNOSIS — Z1159 Encounter for screening for other viral diseases: Secondary | ICD-10-CM

## 2023-08-23 DIAGNOSIS — E119 Type 2 diabetes mellitus without complications: Secondary | ICD-10-CM

## 2023-08-23 HISTORY — DX: Onychogryphosis: L60.2

## 2023-08-23 LAB — POCT GLYCOSYLATED HEMOGLOBIN (HGB A1C): HbA1c, POC (controlled diabetic range): 11.5 % — AB (ref 0.0–7.0)

## 2023-08-23 MED ORDER — INSULIN GLARGINE 100 UNIT/ML SOLOSTAR PEN
10.0000 [IU] | PEN_INJECTOR | SUBCUTANEOUS | 11 refills | Status: DC
Start: 2023-08-23 — End: 2023-10-11

## 2023-08-23 MED ORDER — PEN NEEDLES 32G X 5 MM MISC: 1.0000 | 99 refills | Status: DC | PRN

## 2023-08-23 MED ORDER — TRIAMCINOLONE ACETONIDE 0.1 % EX OINT: 1.0000 | TOPICAL_OINTMENT | Freq: Two times a day (BID) | CUTANEOUS | 0 refills | Status: DC

## 2023-08-23 NOTE — Assessment & Plan Note (Signed)
Lesion is persistent.  Has not been using compression.  Recommended continuing compression as well as controlling diabetes and weight.  Discussed lifestyle interventions.  Will address diabetes as below.  Will also send in triamcinolone 0.1% ointment to be applied twice a day until lightening of skin is achieved based on patient preference.

## 2023-08-23 NOTE — Assessment & Plan Note (Signed)
Patient to schedule with his podiatrist for further evaluation.

## 2023-08-23 NOTE — Progress Notes (Signed)
Asked by provider to evaluate patient for inclusion in Liberate CGM study AND assist with Diabetes management.      S:     Chief Complaint  Patient presents with   Diabetes   check leg   32 y.o. male who presents for diabetes evaluation, education, and management in the context of the LIBERATE Study.   PMH is significant for history of injecting Ozempic (semaglutide) however is not taking currently.    Reports polyuria/nocturia, fatigue and malaise.   Patient has appropriate phone and is willing to enroll in trial.    O:    Lab Results  Component Value Date   HGBA1C 11.5 (A) 08/23/2023    Vitals:   08/23/23 1532  BP: 134/83  Pulse: 90  SpO2: 100%     Lipid Panel     Component Value Date/Time   CHOL 215 (H) 01/28/2021 1426   TRIG 286 (H) 01/28/2021 1426   HDL 30 (L) 01/28/2021 1426   CHOLHDL 7.2 (H) 01/28/2021 1426   LDLCALC 133 (H) 01/28/2021 1426   LDLDIRECT 144 (H) 12/14/2021 1122      A/P:  LIBERATE Study:  -Patient provided verbal consent to participate in the study. Consent documented in electronic medical record.  -Provided education on Libre 3 CGM. Collaborated to ensure Josephine Igo 3 app was downloaded on patient's phone. Educated on how to place sensor every 14 days, patient placed first sensor correctly and verbalized understanding of use, removal, and how to place next sensor. Discussed alarms. 8 sensors provided for a 3 month supply. Educated to contact the office if the sensor falls off early and replacements are needed before their next [employer].   Diabetes currently with poor control secondary to inadequate medication regimen, sedentary lifestyle and dietary indiscretion. Patient is able to verbalize appropriate hypoglycemia management plan.  -Started basal insulin Lantus (insulin glargine). Patient will continue to titrate 1everyday if fasting blood sugar > 150mg /dl until next visit.  -Plan to consider starting Ozempic (semaglutide) at  future visit.  -Continued metformin  -Patient educated on purpose, proper use, and potential adverse effects. -Extensively discussed pathophysiology of diabetes, recommended lifestyle interventions, dietary effects on blood sugar control.  -Counseled on s/sx of and management of hypoglycemia.  -Next A1c anticipated in 3 months for Liberate evaluation.    Written patient instructions provided. Patient verbalized understanding of treatment plan.  Total time in face to face counseling 28 minutes.    Follow-up:  Pharmacist 1 week. PCP clinic visit in TBD.  Patient seen with Raven Powers PharmD Candidate and Andee Poles, PharmD Candidate.

## 2023-08-23 NOTE — Progress Notes (Signed)
    SUBJECTIVE:   CHIEF COMPLAINT / HPI:   Headaches Taking tylenol but does not work. Every other day. Going on for a couple months. No nausea/vomiting. Some photosensitivity. No other problems with walking other than below. No dizziness or off balance. No visual or hearing problems associated with this.  Leg lesion Known. Does not have pain but is hard. Getting worse. Previously seen by vascular. Wears compression stockings "every blue moon" because it hurts. No weeping or fluid. No more swelling than usual.  T2DM On metformin 750 mg daily. Sometimes forgets. No ozempic due to cost. No sugars at home. Okay with starting insulin.  OBJECTIVE:   BP 134/83   Pulse 90   Ht 5\' 7"  (1.702 m)   Wt (!) 349 lb 3.2 oz (158.4 kg)   SpO2 100%   BMI 54.69 kg/m   General: Alert and oriented, in NAD Skin: Warm, dry, and intact; right lower extremity with area of sclerosed, hyperpigmented, scaly changes without weeping or bleeding or increased warmth HEENT: NCAT, EOM grossly normal, midline nasal septum Cardiac: Regular rate Respiratory: Breathing and speaking comfortably on RA Extremities: Moves all extremities grossly equally Neurological: Cranial nerves II through XII intact, strength and sensation intact throughout, gait normal Psychiatric: Appropriate mood and affect   ASSESSMENT/PLAN:   Lipodermatosclerosis of right lower extremity Lesion is persistent.  Has not been using compression.  Recommended continuing compression as well as controlling diabetes and weight.  Discussed lifestyle interventions.  Will address diabetes as below.  Will also send in triamcinolone 0.1% ointment to be applied twice a day until lightening of skin is achieved based on patient preference.  Type 2 diabetes mellitus without complication, without long-term current use of insulin (HCC) A1c increased today to 11.5.  Patient has only been on metformin.  Has not been able to take Ozempic due to cost.  Will enroll  in LIBERATE study with Dr. Raymondo Band to get a CGM.  Send in Lantus 10 units daily with instructions to go up by 1 unit every day for each fasting CBG in the mornings below 150.  Patient reported understanding.  Patient will have follow-up with Dr. Hildred Laser and less than 2 weeks for further follow-up.  Will also check lipid panel today.  Thickened nails Patient to schedule with his podiatrist for further evaluation.  Hypertension Patient has not been taking lisinopril.  Advised to restart this medication for optimal blood pressure control.  Will place future lab order for BMP after initiation.   Health maintenance Referred to ophthalmology for diabetic eye exam.  Also obtain labs today for diabetic kidney evaluation.  HCV testing obtained.  Janeal Holmes, MD [facility]

## 2023-08-23 NOTE — Addendum Note (Signed)
Addended by: Kathrin Ruddy on: 08/23/2023 08:35 PM   Modules accepted: Orders

## 2023-08-23 NOTE — Assessment & Plan Note (Addendum)
Patient has not been taking lisinopril.  Advised to restart this medication for optimal blood pressure control.  Will place future lab order for BMP after initiation.

## 2023-08-23 NOTE — Assessment & Plan Note (Addendum)
A1c increased today to 11.5.  Patient has only been on metformin.  Has not been able to take Ozempic due to cost.  Will enroll in LIBERATE study with Dr. Raymondo Band to get a CGM.  Send in Lantus 10 units daily with instructions to go up by 1 unit every day for each fasting CBG in the mornings below 150.  Patient reported understanding.  Patient will have follow-up with Dr. Raymondo Band and less than 2 weeks for further follow-up.  Will also check lipid panel today.  LIBERATE Study:  -Patient provided verbal consent to participate in the study. Consent documented in electronic medical record.  -Provided education on Libre 3 CGM. Collaborated to ensure Josephine Igo 3 app was downloaded on patient's phone. Educated on how to place sensor every 14 days, patient placed first sensor correctly and verbalized understanding of use, removal, and how to place next sensor. Discussed alarms. 8 sensors provided for a 3 month supply. Educated to contact the office if the sensor falls off early and replacements are needed before their next [employer].   Diabetes currently with poor control secondary to inadequate medication regimen, sedentary lifestyle and dietary indiscretion. Patient is able to verbalize appropriate hypoglycemia management plan.  -Started basal insulin Lantus (insulin glargine). Patient will continue to titrate 1everyday if fasting blood sugar > 150mg /dl until next visit.  -Plan to consider starting Ozempic (semaglutide) at future visit.  -Continued metformin  -Patient educated on purpose, proper use, and potential adverse effects. -Extensively discussed pathophysiology of diabetes, recommended lifestyle interventions, dietary effects on blood sugar control.  -Counseled on s/sx of and management of hypoglycemia.  -Next A1c anticipated in 3 months for Liberate evaluation.

## 2023-08-23 NOTE — Patient Instructions (Addendum)
I will refer you to an ophthalmologist. Call your podiatrist. Take the semglee 10 units daily in the mornings. Continue to go up by 1 unit every day until your sugars in the mornings are less than 150. I have sent in glucose supplies. You will hear more with Dr. Raymondo Band for your diabetes study. I have sent in steroid ointment for your leg to possibly help lighten the skin.

## 2023-08-24 ENCOUNTER — Encounter: Payer: Self-pay | Admitting: Family Medicine

## 2023-08-24 LAB — BASIC METABOLIC PANEL
BUN/Creatinine Ratio: 7 — ABNORMAL LOW (ref 9–20)
BUN: 6 mg/dL (ref 6–20)
CO2: 21 mmol/L (ref 20–29)
Calcium: 10.2 mg/dL (ref 8.7–10.2)
Chloride: 97 mmol/L (ref 96–106)
Creatinine, Ser: 0.82 mg/dL (ref 0.76–1.27)
Glucose: 249 mg/dL — ABNORMAL HIGH (ref 70–99)
Potassium: 4.3 mmol/L (ref 3.5–5.2)
Sodium: 135 mmol/L (ref 134–144)
eGFR: 120 mL/min/{1.73_m2} (ref >59)

## 2023-08-24 LAB — LIPID PANEL
Chol/HDL Ratio: 9.6 ratio — ABNORMAL HIGH (ref 0.0–5.0)
Cholesterol, Total: 239 mg/dL — ABNORMAL HIGH (ref 100–199)
HDL: 25 mg/dL — ABNORMAL LOW (ref >39)
Triglycerides: 835 mg/dL (ref 0–149)

## 2023-08-24 LAB — HCV AB W REFLEX TO QUANT PCR: HCV Ab: NONREACTIVE

## 2023-08-24 LAB — HCV INTERPRETATION

## 2023-08-24 NOTE — Progress Notes (Signed)
Reviewed and agree with Dr Koval's plan.   

## 2023-08-26 ENCOUNTER — Telehealth: Payer: Self-pay | Admitting: Family Medicine

## 2023-08-26 NOTE — Telephone Encounter (Signed)
Called patient to discuss elevated lipid panel and diabetes. Patient states he has been taking 10 units of lantus daily. His CGM fell off, but he was able to replace it. His sugar is currently 306. He has not been increasing his units in the mornings based on CBGs >150 yet. Advised to go up by 1 unit daily should fasting CBGs in AM be >150.  Informed patient TG above 800. He will need a fasting lipid panel recheck; this appointment has been made with me on 9/17 (given his next appointment with Dr. Raymondo Band is at 3:30 PM on 9/12). Advised to go up on lipitor to 80 mg daily given otherwise elevated lipid panel. Can consider fibrate at next visit should TG remain elevated. Patient reports understanding.

## 2023-08-30 ENCOUNTER — Ambulatory Visit: Payer: BLUE CROSS/BLUE SHIELD | Admitting: Pharmacist

## 2023-09-04 ENCOUNTER — Ambulatory Visit: Payer: Self-pay | Admitting: Family Medicine

## 2023-10-08 ENCOUNTER — Telehealth: Payer: Self-pay | Admitting: Pharmacist

## 2023-10-08 NOTE — Telephone Encounter (Signed)
Patient contacted to schedule follow-up appointment after previous appointment on 08/30/2023 was missed. Ms. Fred Reyes and Mr. Fred Reyes spoke on the phone. Appointment rescheduled for Thursday, 09/11/2023 at 3:00 pm.  Total time with patient call and documentation of interaction: 7 minutes

## 2023-10-11 ENCOUNTER — Encounter: Payer: Self-pay | Admitting: Pharmacist

## 2023-10-11 ENCOUNTER — Ambulatory Visit: Payer: BLUE CROSS/BLUE SHIELD | Admitting: Pharmacist

## 2023-10-11 VITALS — BP 140/96 | HR 88 | Wt 350.2 lb

## 2023-10-11 DIAGNOSIS — Z7985 Long-term (current) use of injectable non-insulin antidiabetic drugs: Secondary | ICD-10-CM | POA: Diagnosis not present

## 2023-10-11 DIAGNOSIS — E1169 Type 2 diabetes mellitus with other specified complication: Secondary | ICD-10-CM | POA: Diagnosis not present

## 2023-10-11 DIAGNOSIS — E785 Hyperlipidemia, unspecified: Secondary | ICD-10-CM | POA: Diagnosis not present

## 2023-10-11 DIAGNOSIS — E119 Type 2 diabetes mellitus without complications: Secondary | ICD-10-CM

## 2023-10-11 DIAGNOSIS — I10 Essential (primary) hypertension: Secondary | ICD-10-CM | POA: Diagnosis not present

## 2023-10-11 MED ORDER — INSULIN GLARGINE 100 UNIT/ML SOLOSTAR PEN: 20.0000 [IU] | PEN_INJECTOR | SUBCUTANEOUS | 11 refills | Status: DC

## 2023-10-11 MED ORDER — OZEMPIC (0.25 OR 0.5 MG/DOSE) 2 MG/1.5ML ~~LOC~~ SOPN: 0.2500 mg | PEN_INJECTOR | SUBCUTANEOUS | 1 refills | Status: DC

## 2023-10-11 NOTE — Progress Notes (Signed)
S:     Chief Complaint  Patient presents with   Medication Management    T2DM & LIBERATE F/U   32 y.o. male who presents for diabetes evaluation, education, and management. Patient arrives in good spirits and presents without any assistance. Has black discoloration on right leg that is hard, and sometimes itches but does not hurt. May be moving back to [place] in the future, but currently unsure.    LIBERATE F/U - reporting sensors are not sticking longer than a few days.   Patient was referred and last seen by Primary Care Provider, Dr. Phineas Real, on 08/23/2023.   PMH is significant for T2DM, HTN, HLD.  At last visit, enrolled in LIBERATE study and provided with 8 sensors for 3 month supply.   Patient reports Diabetes was diagnosed in 2020.   Family/Social History: Unknown  Current diabetes medications include: Lantus (insulin glargine) 10-12 units once daily, metformin XR 750 mg twice daily, - not yet restarted on semaglutide (Ozempic) 0.5 mg once weekly Current hypertension medications include: lisinopril 10 mg once daily Current hyperlipidemia medications include: atorvastatin 40 mg once daily  Patient denies adherence with medications. Takes insulin and metformin every day, but does not take atorvastatin, lisinopril, or semaglutide (Ozempic)   Do you feel that your medications are working for you? No - Doesn't feel like metformin is working Have you been experiencing any side effects to the medications prescribed? Yes - feels bloated Insurance coverage: BCBS & [place] [employer]  Patient denies hypoglycemic events.   Patient reports nocturia (nighttime urination). Constantly having to go at night > 5 times nightly Patient reports neuropathy (nerve pain). Patient reports visual changes.  Patient reports self foot exams.   Patient reported dietary habits: Eats 1 meals/day - gets headaches from pork Dinner: Baked chicken, rice, sometimes broccoli  Snacks: Chips Drinks:  Gatorade  Patient-reported exercise habits: None  O:   Review of Systems  Genitourinary:  Positive for frequency (nocturia > 5 x reported).  Musculoskeletal:        Right leg skin discoloration.  Possible venous insufficiency or stasis dermatitis.  No signs of infection.   All other systems reviewed and are negative.   Physical Exam Vitals reviewed.  Constitutional:      Appearance: Normal appearance.  Pulmonary:     Effort: Pulmonary effort is normal.  Skin:    Findings: Lesion (right lower extremity, signs of fibrosis, discoloration no skin breakdown, swelling or excess warmth.) present.  Neurological:     Mental Status: He is alert.  Psychiatric:        Mood and Affect: Mood normal.        Behavior: Behavior normal.        Thought Content: Thought content normal.        Judgment: Judgment normal.    Not checking BG at home  7 day average blood glucose: 268  Libre3 CGM Download today from 9/17-9/30 % Time CGM is active: 81% Average Glucose: 238 mg/dL Glucose Management Indicator: 9.7  Glucose Variability: 15.4% (goal <36%) Time in Goal:  - Time in range 70-180: 1% - Time above range: 99% - Time below range: 0% Observed patterns:   Lab Results  Component Value Date   HGBA1C 11.5 (A) 08/23/2023   Vitals:   10/11/23 1506 10/11/23 1519  BP: (!) 141/99 (!) 140/96  Pulse: 88   SpO2: 99%     Lipid Panel     Component Value Date/Time   CHOL 239 (H)  08/23/2023 1652   TRIG 835 (HH) 08/23/2023 1652   HDL 25 (L) 08/23/2023 1652   CHOLHDL 9.6 (H) 08/23/2023 1652   LDLCALC Comment (A) 08/23/2023 1652   LDLDIRECT 144 (H) 12/14/2021 1122    Patient is participating in a Managed Medicaid Plan:  Yes   A/P: Diabetes longstanding currently remains uncontrolled. Patient is able to verbalize appropriate hypoglycemia management plan. Medication adherence appears poor. Control is suboptimal due to poor medication adherence and suboptimal medication regimen. Pt  reported CGMs would not stick, so he could not see his glucose level, and stopped titration of insulin at ~12 units/day. New patient complaint of skin change on right lower leg.   - Placed new Libre 3 sensor  - Educated Pt on skin tac and various options to improve sensor adherence to skin. - Increased Lantus (insulin glargine) to 20 units once daily and increase by 2 units every day if fasting sugar greater than 150 - Restarted semaglutide (Ozempic) at 0.25 mg once weekly - Discussed elevation and lifestyle modifications for venous stasis - reevaluate at next visit.  -Patient educated on purpose, proper use, and potential adverse effects.  -Extensively discussed pathophysiology of diabetes, recommended lifestyle interventions, dietary effects on blood sugar control.  -Counseled on s/sx of and management of hypoglycemia.  - Sent prescriptions of Lantus (insulin glargine) and semaglutide (Ozempic) to Pt's pharmacy of choice  ASCVD risk - primary prevention in patient with diabetes. Medication adherence poor, with non-adherence to medications, has medications on hand, just not taking them.  -Restarted atorvastatin 40 mg once daily  Hypertension longstanding currently uncontrolled. Blood pressure goal of <130/80 mmHg. Medication adherence poor. Blood pressure control is suboptimal due to non-adherence to medications, has medications on hand, just not taking them.  -Restarted lisinopril 10 mg once daily  Consider ARB and combination therapy at next evaluation.   Written patient instructions provided. Patient verbalized understanding of treatment plan.  Total time in face to face counseling 39 minutes.    Follow-up:  Pharmacist 10/26/2023 PCP clinic visit in [place] Patient seen with Shona Simpson, PharmD Candidate.

## 2023-10-11 NOTE — Patient Instructions (Signed)
It was nice to see you today!  Your goal blood sugar is 80-130 before eating and less than 180 after eating.  Try to avoid putting lotion on area of arm where you are putting Libre 3 sensor to help it stick. If continuing to have issues with sensor sticking can purchase Skin Grip, waterproof bandages, etc. to keep sensor on  Medication Changes: RESTART lisinopril 10 mg once daily RESTART atorvastatin 40 mg once daily  START semaglutide (Ozempic) 0.25 mg once weekly   Increase Lantus (insulin glargine) to 20 units once daily and increase by 2 units every day if fasting sugar greater than 150  Continue all other medication the same.   Keep up the good work with diet and exercise. Aim for a diet full of vegetables, fruit and lean meats (chicken, [place], fish). Try to limit salt intake by eating fresh or frozen vegetables (instead of canned), rinse canned vegetables prior to cooking and do not add any additional salt to meals.

## 2023-10-12 NOTE — Assessment & Plan Note (Signed)
Hypertension longstanding currently uncontrolled. Blood pressure goal of <130/80 mmHg. Medication adherence poor. Blood pressure control is suboptimal due to non-adherence to medications, has medications on hand, just not taking them.  -Restarted lisinopril 10 mg once daily  Consider ARB and combination therapy at next evaluation.

## 2023-10-12 NOTE — Assessment & Plan Note (Signed)
ASCVD risk - primary prevention in patient with diabetes. Medication adherence poor, with non-adherence to medications, has medications on hand, just not taking them.  -Restarted atorvastatin 40 mg once daily

## 2023-10-12 NOTE — Assessment & Plan Note (Signed)
Diabetes longstanding currently remains uncontrolled. Patient is able to verbalize appropriate hypoglycemia management plan. Medication adherence appears poor. Control is suboptimal due to poor medication adherence and suboptimal medication regimen. Pt reported CGMs would not stick, so he could not see his glucose level, and stopped titration of insulin at ~12 units/day. New patient complaint of skin change on right lower leg.   - Placed new Libre 3 sensor  - Educated Pt on skin tac and various options to improve sensor adherence to skin. - Increased Lantus (insulin glargine) to 20 units once daily and increase by 2 units every day if fasting sugar greater than 150 - Restarted semaglutide (Ozempic) at 0.25 mg once weekly - Discussed elevation and lifestyle modifications for venous stasis - reevaluate at next visit.

## 2023-10-19 NOTE — Progress Notes (Signed)
Reviewed and agree with Dr Koval's plan.   

## 2023-10-21 ENCOUNTER — Encounter: Payer: Self-pay | Admitting: Family Medicine

## 2023-10-21 DIAGNOSIS — G4733 Obstructive sleep apnea (adult) (pediatric): Secondary | ICD-10-CM

## 2023-10-23 ENCOUNTER — Encounter (HOSPITAL_COMMUNITY): Payer: Self-pay

## 2023-10-23 ENCOUNTER — Encounter: Payer: Self-pay | Admitting: Family Medicine

## 2023-10-23 ENCOUNTER — Other Ambulatory Visit: Payer: Self-pay

## 2023-10-23 ENCOUNTER — Emergency Department (HOSPITAL_COMMUNITY): Payer: BLUE CROSS/BLUE SHIELD

## 2023-10-23 ENCOUNTER — Emergency Department (HOSPITAL_COMMUNITY)
Admission: EM | Admit: 2023-10-23 | Discharge: 2023-10-23 | Disposition: A | Discharge status: Home / Self Care | Payer: BLUE CROSS/BLUE SHIELD | Attending: Emergency Medicine | Admitting: Emergency Medicine

## 2023-10-23 DIAGNOSIS — R112 Nausea with vomiting, unspecified: Secondary | ICD-10-CM | POA: Insufficient documentation

## 2023-10-23 DIAGNOSIS — E871 Hypo-osmolality and hyponatremia: Secondary | ICD-10-CM | POA: Diagnosis not present

## 2023-10-23 DIAGNOSIS — E119 Type 2 diabetes mellitus without complications: Secondary | ICD-10-CM | POA: Insufficient documentation

## 2023-10-23 DIAGNOSIS — D72829 Elevated white blood cell count, unspecified: Secondary | ICD-10-CM | POA: Insufficient documentation

## 2023-10-23 DIAGNOSIS — I10 Essential (primary) hypertension: Secondary | ICD-10-CM | POA: Diagnosis not present

## 2023-10-23 DIAGNOSIS — Z794 Long term (current) use of insulin: Secondary | ICD-10-CM | POA: Insufficient documentation

## 2023-10-23 DIAGNOSIS — R1084 Generalized abdominal pain: Secondary | ICD-10-CM | POA: Insufficient documentation

## 2023-10-23 DIAGNOSIS — Z79899 Other long term (current) drug therapy: Secondary | ICD-10-CM | POA: Diagnosis not present

## 2023-10-23 DIAGNOSIS — Z7984 Long term (current) use of oral hypoglycemic drugs: Secondary | ICD-10-CM | POA: Insufficient documentation

## 2023-10-23 LAB — CBC
HCT: 47.9 % (ref 39.0–52.0)
Hemoglobin: 15.5 g/dL (ref 13.0–17.0)
MCH: 27.5 pg (ref 26.0–34.0)
MCHC: 32.4 g/dL (ref 30.0–36.0)
MCV: 85.1 fL (ref 80.0–100.0)
Platelets: 361 10*3/uL (ref 150–400)
RBC: 5.63 MIL/uL (ref 4.22–5.81)
RDW: 13.7 % (ref 11.5–15.5)
WBC: 20.8 10*3/uL — ABNORMAL HIGH (ref 4.0–10.5)
nRBC: 0 % (ref 0.0–0.2)

## 2023-10-23 LAB — CBG MONITORING, ED
Glucose-Capillary: 404 mg/dL — ABNORMAL HIGH (ref 70–99)
Glucose-Capillary: 300 mg/dL — ABNORMAL HIGH (ref 70–99)

## 2023-10-23 LAB — URINALYSIS, ROUTINE W REFLEX MICROSCOPIC
Bacteria, UA: NONE SEEN
Bilirubin Urine: NEGATIVE
Glucose, UA: >=500 mg/dL — AB
Hgb urine dipstick: NEGATIVE
Ketones, ur: 80 mg/dL — AB
Leukocytes,Ua: NEGATIVE
Nitrite: NEGATIVE
Protein, ur: NEGATIVE mg/dL
Specific Gravity, Urine: 1.027 (ref 1.005–1.030)
pH: 5 (ref 5.0–8.0)

## 2023-10-23 LAB — COMPREHENSIVE METABOLIC PANEL
ALT: 46 U/L — ABNORMAL HIGH (ref 0–44)
AST: 48 U/L — ABNORMAL HIGH (ref 15–41)
Albumin: 4.5 g/dL (ref 3.5–5.0)
Alkaline Phosphatase: 58 U/L (ref 38–126)
Anion gap: 15 (ref 5–15)
BUN: 11 mg/dL (ref 6–20)
CO2: 20 mmol/L — ABNORMAL LOW (ref 22–32)
Calcium: 9.4 mg/dL (ref 8.9–10.3)
Chloride: 98 mmol/L (ref 98–111)
Creatinine, Ser: 1.04 mg/dL (ref 0.61–1.24)
GFR, Estimated: >60 mL/min (ref >60)
Glucose, Bld: 389 mg/dL — ABNORMAL HIGH (ref 70–99)
Potassium: 3.9 mmol/L (ref 3.5–5.1)
Sodium: 133 mmol/L — ABNORMAL LOW (ref 135–145)
Total Bilirubin: 0.7 mg/dL (ref <1.2)
Total Protein: 8.8 g/dL — ABNORMAL HIGH (ref 6.5–8.1)

## 2023-10-23 LAB — BASIC METABOLIC PANEL
Anion gap: 14 (ref 5–15)
BUN: 10 mg/dL (ref 6–20)
CO2: 20 mmol/L — ABNORMAL LOW (ref 22–32)
Calcium: 9.5 mg/dL (ref 8.9–10.3)
Chloride: 99 mmol/L (ref 98–111)
Creatinine, Ser: 0.8 mg/dL (ref 0.61–1.24)
GFR, Estimated: >60 mL/min (ref >60)
Glucose, Bld: 268 mg/dL — ABNORMAL HIGH (ref 70–99)
Potassium: 4.5 mmol/L (ref 3.5–5.1)
Sodium: 133 mmol/L — ABNORMAL LOW (ref 135–145)

## 2023-10-23 LAB — LIPASE, BLOOD: Lipase: 32 U/L (ref 11–51)

## 2023-10-23 MED ORDER — INSULIN GLARGINE 100 UNIT/ML SOLOSTAR PEN: 20.0000 [IU] | PEN_INJECTOR | SUBCUTANEOUS | 11 refills | Status: DC

## 2023-10-23 MED ORDER — LISINOPRIL 10 MG PO TABS
10.0000 mg | ORAL_TABLET | Freq: Once | ORAL | Status: AC
  Administered 2023-10-23: 10 mg via ORAL
  Filled 2023-10-23: qty 1

## 2023-10-23 MED ORDER — ONDANSETRON HCL 4 MG/2ML IJ SOLN
4.0000 mg | Freq: Once | INTRAMUSCULAR | Status: AC | PRN
  Administered 2023-10-23: 4 mg via INTRAVENOUS
  Filled 2023-10-23: qty 2

## 2023-10-23 MED ORDER — LACTATED RINGERS IV BOLUS
1000.0000 mL | Freq: Once | INTRAVENOUS | Status: AC
  Administered 2023-10-23: 1000 mL via INTRAVENOUS

## 2023-10-23 MED ORDER — ONDANSETRON 4 MG PO TBDP: 4.0000 mg | ORAL_TABLET | Freq: Three times a day (TID) | ORAL | 0 refills | Status: DC | PRN

## 2023-10-23 MED ORDER — IOHEXOL 300 MG/ML  SOLN
100.0000 mL | Freq: Once | INTRAMUSCULAR | Status: AC | PRN
Start: 2023-10-23 — End: 2023-10-23
  Administered 2023-10-23: 100 mL via INTRAVENOUS

## 2023-10-23 MED ORDER — INSULIN ASPART PROT & ASPART (70-30 MIX) 100 UNIT/ML ~~LOC~~ SUSP
10.0000 [IU] | Freq: Once | SUBCUTANEOUS | Status: AC
  Administered 2023-10-23: 10 [IU] via SUBCUTANEOUS
  Filled 2023-10-23: qty 10

## 2023-10-23 NOTE — ED Provider Notes (Signed)
Patient is a handoff from Melina Modena, PA-C. Plan at time of handoff is to follow up on CTAP and dispo accordingly. Patient comfortable after fluids and insulin. Not actively vomiting or endorsing abdominal pain. Physical Exam  BP (!) 144/83   Pulse (!) 104   Temp 98.3 F (36.8 C) (Oral)   Resp 18   Ht 5\' 7"  (1.702 m)   Wt (!) 158.8 kg   SpO2 91%   BMI 54.82 kg/m   Physical Exam Vitals and nursing note reviewed.  Constitutional:      General: He is not in acute distress.    Appearance: He is well-developed. He is obese.  HENT:     Head: Normocephalic and atraumatic.  Eyes:     Conjunctiva/sclera: Conjunctivae normal.  Cardiovascular:     Rate and Rhythm: Normal rate and regular rhythm.     Heart sounds: No murmur heard. Pulmonary:     Effort: Pulmonary effort is normal. No respiratory distress.     Breath sounds: Normal breath sounds.  Abdominal:     General: Abdomen is protuberant. Bowel sounds are normal.     Palpations: Abdomen is soft.     Tenderness: There is no abdominal tenderness.  Musculoskeletal:        General: No swelling.     Cervical back: Neck supple.  Skin:    General: Skin is warm and dry.     Capillary Refill: Capillary refill takes less than 2 seconds.  Neurological:     General: No focal deficit present.     Mental Status: He is alert.  Psychiatric:        Mood and Affect: Mood normal.     Procedures  Procedures  ED Course / MDM   Clinical Course as of 10/23/23 1912  Tue Oct 23, 2023  1531 Benign appearing. Waiting for CTAP given abdominal pain but exam was unremarkable earlier. Likely dc home. [OZ]    Clinical Course User Index [OZ] Smitty Knudsen, PA-C   Medical Decision Making Amount and/or Complexity of Data Reviewed Labs: ordered. Decision-making details documented in ED Course. Radiology: ordered.  Risk Prescription drug management.   Patient is a handoff from Melina Modena, PA-C.  Please see her note for full HPI and  physical exam findings.  Plan at time of handoff is to follow up on CTAP and dispo accordingly. Patient comfortable after fluids and insulin. Not actively vomiting or endorsing abdominal pain.  CTAP reassuring with no obvious abnormality. Patient otherwise resting comfortably. Requesting refills of his insulin and lisinopril. These medications were ordered and sent to patient's pharmacy.  Discussed return precautions with patient and him and family in agreement with current plan.  No other acute concerns at this time or indication for further workup or admission.  Discharged home in stable condition with advised to follow-up closely with primary care provider for repeat assessment.       Smitty Knudsen, PA-C 10/23/23 1912    Laurence Spates, MD 10/25/23 [phone]

## 2023-10-23 NOTE — Discharge Instructions (Addendum)
You are seen in the emergency department today with concerns of nausea, vomiting, abdominal pain.  Thankfully your labs and imaging are all reassuring today.  You did have some noticeable hyperglycemia so this was treated with fluids and insulin.  A prescription for your insulin was also sent to your pharmacy to help you have access to this.  I have also sent a prescription for Zofran for nausea control.  Please follow-up with your primary care provider, Dr. Phineas Real, for further evaluation within the next several days.  If you feel that symptoms are worsening or new symptoms develop, return to the emergency department for repeat evaluation.

## 2023-10-23 NOTE — Telephone Encounter (Signed)
Called patient.   Patient reports that he has been having elevated blood sugar levels. Most recent CBG was 210.  Patient is also reporting vomiting and abdominal pain. He has had five episodes of emesis today. Denies fever.   He took 10 units of Lantus this morning. Patient unable to continue conversation due to another episode of vomiting. Spoke with patient's significant other. Patient is not tolerating fluids.   Due to the above symptoms, recommended ED evaluation. She is going to take him to the ED for further evaluation.   Veronda Prude, RN

## 2023-10-23 NOTE — ED Provider Notes (Signed)
[place] EMERGENCY DEPARTMENT AT [facility] Provider Note   CSN: [id number] Arrival date & time: 10/23/23  1246     History  Chief Complaint  Patient presents with   Nausea   Emesis   Abdominal Pain    Fred Reyes is a 32 y.o. male past medical history significant for hypertension, diabetes, hyperlipidemia, and morbid obesity presents today for nausea, vomiting, abdominal pain.  Patient and patient's family state they gave him his insulin this morning and he has been vomiting since.  Patient is unsure of how many times he has vomited.  He endorses nausea prior to vomiting but no nausea currently.  He also endorses periumbilical pain which has since resolved.  Patient denies constipation, fever, chills, headache, chest pain, shortness of breath, previous abdominal surgeries, weakness, dizziness, or urinary symptoms.   Emesis Associated symptoms: abdominal pain   Abdominal Pain Associated symptoms: nausea and vomiting        Home Medications Prior to Admission medications   Medication Sig Start Date End Date Taking? Authorizing Provider  atorvastatin (LIPITOR) 40 MG tablet Take 1 tablet (40 mg total) by mouth daily. Patient not taking: Reported on 10/11/2023 03/03/22   Lilland, Alana, DO  insulin glargine (LANTUS) 100 UNIT/ML Solostar Pen Inject 20-48 Units into the skin every morning. Go up 2 units per day if fasting sugar greater than 150. 10/11/23   McDiarmid, Leighton Roach, MD  Insulin Pen Needle (PEN NEEDLES) 32G X 5 MM MISC 1 Container by Does not apply route as needed. 08/23/23   McDiarmid, Leighton Roach, MD  lisinopril (ZESTRIL) 10 MG tablet Take 1 tablet (10 mg total) by mouth at bedtime. Patient not taking: Reported on 08/23/2023 12/20/22   Lilland, Alana, DO  metFORMIN (GLUCOPHAGE-XR) 750 MG 24 hr tablet Take 1 tablet (750 mg total) by mouth in the morning and at bedtime. 12/20/22   Lilland, Alana, DO  Semaglutide,0.25 or 0.5MG /DOS, (OZEMPIC, 0.25 OR 0.5 MG/DOSE,) 2 MG/1.5ML  SOPN Inject 0.25 mg into the skin once a week. 10/11/23   McDiarmid, Leighton Roach, MD  triamcinolone ointment (KENALOG) 0.1 % Apply 1 Application topically 2 (two) times daily. Apply until skin lightening is achieved. 08/23/23   Evette Georges, MD      Allergies    Patient has no known allergies.    Review of Systems   Review of Systems  Gastrointestinal:  Positive for abdominal pain, nausea and vomiting.    Physical Exam Updated Vital Signs BP (!) 150/89   Pulse (!) 108   Temp 98.4 F (36.9 C) (Oral)   Resp 18   Ht 5\' 7"  (1.702 m)   Wt (!) 158.8 kg   SpO2 97%   BMI 54.82 kg/m  Physical Exam Vitals and nursing note reviewed.  Constitutional:      General: He is not in acute distress.    Appearance: He is well-developed. He is obese.  HENT:     Head: Normocephalic and atraumatic.  Eyes:     Conjunctiva/sclera: Conjunctivae normal.  Cardiovascular:     Rate and Rhythm: Normal rate and regular rhythm.     Heart sounds: No murmur heard. Pulmonary:     Effort: Pulmonary effort is normal. No respiratory distress.     Breath sounds: Normal breath sounds.  Abdominal:     General: Abdomen is protuberant. Bowel sounds are normal.     Palpations: Abdomen is soft.     Tenderness: There is no abdominal tenderness.  Musculoskeletal:  General: No swelling.     Cervical back: Neck supple.  Skin:    General: Skin is warm and dry.     Capillary Refill: Capillary refill takes less than 2 seconds.  Neurological:     General: No focal deficit present.     Mental Status: He is alert.  Psychiatric:        Mood and Affect: Mood normal.     ED Results / Procedures / Treatments   Labs (all labs ordered are listed, but only abnormal results are displayed) Labs Reviewed  CBC - Abnormal; Notable for the following components:      Result Value   WBC 20.8 (*)    All other components within normal limits  CBG MONITORING, ED - Abnormal; Notable for the following components:    Glucose-Capillary 404 (*)    All other components within normal limits  LIPASE, BLOOD  COMPREHENSIVE METABOLIC PANEL  URINALYSIS, ROUTINE W REFLEX MICROSCOPIC    EKG None  Radiology No results found.  Procedures Procedures    Medications Ordered in ED Medications - No data to display  ED Course/ Medical Decision Making/ A&P Clinical Course as of 10/23/23 1532  Tue Oct 23, 2023  1531 Benign appearing. Waiting for CTAP given abdominal pain but exam was unremarkable earlier. Likely dc home. [OZ]    Clinical Course User Index [OZ] Smitty Knudsen, PA-C                                 Medical Decision Making Amount and/or Complexity of Data Reviewed Labs: ordered.   This patient presents to the ED with chief complaint(s) of nausea, vomiting, abdominal pain with pertinent past medical history of diabetes which further complicates the presenting complaint. The complaint involves an extensive differential diagnosis and also carries with it a high risk of complications and morbidity.    The differential diagnosis includes DKA, gastroenteritis,  Additional history obtained: Additional history obtained from family Records reviewed Primary Care Documents  ED Course and Reassessment: Patient given 1 L LR and 10 units NovoLog  Independent labs interpretation:  The following labs were independently interpreted:  CBC: Leukocytosis of 20.8 CMP: Patient mildly hyponatremic, mildly decreased CO2 at 20, anion gap of 15, mildly elevated AST and ALT UA:  Lipase: 32 CBG: 389  Independent visualization of imaging: - I independently visualized the following imaging with scope of interpretation limited to determining acute life threatening conditions related to emergency care: CT abdomen pelvis with contrast, which revealed pending   Patient signed out to [facility], PA-C at shift change pending UA and CT abdomen pelvis with contrast and BMP after LR bolus        Final Clinical  Impression(s) / ED Diagnoses Final diagnoses:  None    Rx / DC Orders ED Discharge Orders     None         Dolphus Jenny, PA-C 10/23/23 1533    Glynn Octave, MD 10/23/23 [phone]

## 2023-10-23 NOTE — ED Triage Notes (Signed)
Pt arrived via POV. C/o N/V, and abd pain since this AM. Hx DM2  AOx4

## 2023-10-24 ENCOUNTER — Telehealth: Payer: Self-pay

## 2023-10-24 NOTE — Telephone Encounter (Signed)
Patient calls nurse line in regards to Lantus.   He reports he never picked up prescription sent in by [facility] on 10/24. He reports the copay is 70 dollars and he can not afford this.   I called the pharmacy and they reports his BCBS covers the medication, however the copay is 70.   Advised he also has Medicaid, she reports to have him bring his card so she can rerun the prescription under medicaid.   Patient agreed with pan.

## 2023-10-26 ENCOUNTER — Encounter: Payer: Self-pay | Admitting: Pharmacist

## 2023-10-26 ENCOUNTER — Ambulatory Visit (INDEPENDENT_AMBULATORY_CARE_PROVIDER_SITE_OTHER): Payer: BLUE CROSS/BLUE SHIELD | Admitting: Pharmacist

## 2023-10-26 VITALS — BP 139/102 | HR 77 | Wt 347.0 lb

## 2023-10-26 DIAGNOSIS — I10 Essential (primary) hypertension: Secondary | ICD-10-CM

## 2023-10-26 DIAGNOSIS — E1169 Type 2 diabetes mellitus with other specified complication: Secondary | ICD-10-CM

## 2023-10-26 DIAGNOSIS — E119 Type 2 diabetes mellitus without complications: Secondary | ICD-10-CM

## 2023-10-26 DIAGNOSIS — E785 Hyperlipidemia, unspecified: Secondary | ICD-10-CM

## 2023-10-26 MED ORDER — OZEMPIC (0.25 OR 0.5 MG/DOSE) 2 MG/1.5ML ~~LOC~~ SOPN: 0.2500 mg | PEN_INJECTOR | SUBCUTANEOUS | 1 refills | Status: DC

## 2023-10-26 MED ORDER — ATORVASTATIN CALCIUM 40 MG PO TABS: 40.0000 mg | ORAL_TABLET | Freq: Every day | ORAL | 3 refills | Status: DC

## 2023-10-26 NOTE — Progress Notes (Signed)
S:     Chief Complaint  Patient presents with   Medication Management    T2DM F/U   32 y.o. male who presents for diabetes evaluation, education, and management. Patient arrives in good spirits and presents without any assistance. Reports that his wife has told him his stomach is "hard" and he feels bloated, reports constipation every other day.   Patient was last seen by Primary Care Provider, Dr. Phineas Real, on 08/23/2023. ED visit on 10/23/2023 due to elevated BG, vomiting, and abdominal pain.   PMH is significant for T2DM, HTN, HLD.  At last pharmacy visit, plan was to increase Lantus (insulin glargine) to 20 units once daily and increase by 2 units every day if fasting sugar greater than 150, plan was to restart semaglutide (Ozempic) at 0.25 mg once weekly, restarted atorvastatin 40 mg once daily, restarted lisinopril 10 mg once daily  Patient reports Diabetes was diagnosed in 2020.   Family/Social History: Unknown - Doesn't know much about his biologic parents  Current diabetes medications include: insulin Lantus (insulin glargine) 10-20 units per day, metformin XR 750 mg BID, semaglutide (Ozempic) 0.25 mg intermittently taking and less than once weekly Current hypertension medications include: lisinopril 10 mg once daily Current hyperlipidemia medications include: atorvastatin 40 mg once daily not taking routinely requests refills  Patient denies adherence with medications, reports missing medications a few times per week, on average. Not taking his atorvastatin. Reports he has been taking insulin supply that he had at home, could not afford new prescription. Reporting Ozempic use every other week, confusion between difference between Ozempic and insulin.   Do you feel that your medications are working for you? no Have you been experiencing any side effects to the medications prescribed? Yes - constipation every other day Do you have any problems obtaining medications due to  transportation or finances? yes Insurance coverage: BCBS & [place] [employer]  Patient denies hypoglycemic events.  Patient reports nocturia (nighttime urination). Constantly having to go at night > 5 times nightly  Patient reports neuropathy (nerve pain). Occasional tingling in legs Patient reports visual changes. Patient reports self foot exams.   Patient reported dietary habits: Eats 1-2 meals/day - gets headaches from pork - started eating a little healthier since his ED visit, first meal of the day ~2 pm Dinner: Baked chicken, rice, sometimes broccoli  Snacks: Chips Drinks: Gatorade, soda, water  Patient-reported exercise habits: None, occasional housework  O:   Review of Systems  Gastrointestinal:  Positive for constipation (every other day).  Genitourinary:  Positive for frequency (nocturia (>5x nightly)).  All other systems reviewed and are negative.   Physical Exam Vitals reviewed.  Constitutional:      Appearance: Normal appearance.  Pulmonary:     Effort: Pulmonary effort is normal.  Neurological:     Mental Status: He is alert.  Psychiatric:        Mood and Affect: Mood normal.        Behavior: Behavior normal.        Thought Content: Thought content normal.        Judgment: Judgment normal.    7 day average blood glucose: 344  Libre3 CGM Download 10/25/2023 % Time CGM is active: 98% Average Glucose: 344 mg/dL Glucose Management Indicator: 11.5  Glucose Variability: 13.9% (goal <36%) Time in Goal:  - Time in range 70-180: 0% - Time above range: 100% - Time below range: 0%  Lab Results  Component Value Date   HGBA1C 11.5 (A)  08/23/2023   Vitals:   10/26/23 0919 10/26/23 0920  BP: (!) 128/107 (!) 139/102  Pulse: 77   SpO2: 98%     Lipid Panel     Component Value Date/Time   CHOL 239 (H) 08/23/2023 1652   TRIG 835 (HH) 08/23/2023 1652   HDL 25 (L) 08/23/2023 1652   CHOLHDL 9.6 (H) 08/23/2023 1652   LDLCALC Comment (A) 08/23/2023 1652    LDLDIRECT 144 (H) 12/14/2021 1122   Patient is participating in a Managed Medicaid Plan:  Yes   A/P: Diabetes longstanding currently uncontrolled. Patient is able to verbalize appropriate hypoglycemia management plan. Medication adherence appears poor. Control is suboptimal due to medication non-adherence due to cost issues and patient confusion on regimen. -Increased insulin Lantus (insulin glargine) from patient reported 10 units 1-2x daily to 25 units once daily -Plan to call in ~1 week to consider additional increase to insulin regimen  -Restarted Ozempic (semaglutide) 0.25 mg once weekly (Tuesdays) -Educated patient on differences between insulin and semaglutide, and importance of adherence to both  Patient educated on purpose, proper use, and potential adverse effects.  -Extensively discussed pathophysiology of diabetes, recommended lifestyle interventions, dietary effects on blood sugar control.  -Counseled on s/sx of and management of hypoglycemia.   ASCVD risk - primary  prevention in patient with diabetes. Medication adherence poor, with non-adherence to medications, may have medications on hand, just not taking them.  -Restarted atorvastatin 40 mg once daily  Hypertension longstanding currently uncontrolled. Blood pressure goal of <130/80 mmHg. Medication adherence poor. Blood pressure control is suboptimal due to suboptimal medication regimen and poor adherence to medications. -Increased lisinopril 10 mg from 1 tablet to 2 tablets once daily (20 mg) - F/U Labs Ordered - BMET - Consider ARB at future appointment  Written patient instructions provided. Patient verbalized understanding of treatment plan.  Total time in face to face counseling 47 minutes.    Follow-up:  Pharmacist 11/05/2023, Call ~1 week to check in  PCP clinic visit in PRN Patient seen with Lendon Ka, PharmD Candidate and Shona Simpson, PharmD Candidate.    BMET - Normal results - stable - unchanged

## 2023-10-26 NOTE — Assessment & Plan Note (Signed)
Hypertension longstanding currently uncontrolled. Blood pressure goal of <130/80 mmHg. Medication adherence poor. Blood pressure control is suboptimal due to suboptimal medication regimen and poor adherence to medications. -Increased lisinopril 10 mg from 1 tablet to 2 tablets once daily (20 mg) - F/U Labs Ordered - BMET - Consider ARB at future appointment

## 2023-10-26 NOTE — Assessment & Plan Note (Signed)
ASCVD risk - primary  prevention in patient with diabetes. Medication adherence poor, with non-adherence to medications, may have medications on hand, just not taking them.  -Restarted atorvastatin 40 mg once daily

## 2023-10-26 NOTE — Patient Instructions (Signed)
It was nice to see you today!  Your goal blood sugar is 80-130 before eating and less than 180 after eating.  Medication Changes: RESTART atorvastatin 40 mg once daily RESTART Ozempic 0.25 mg once a week on Tuesdays INCREASE lisinopril 10 mg from 1 tablet to 2 tablets once daily INCREASE your insulin to 25 units once a day in the morning  Continue all other medication the same.   Keep up the good work with diet and exercise. Aim for a diet full of vegetables, fruit and lean meats (chicken, [place], fish). Try to limit salt intake by eating fresh or frozen vegetables (instead of canned), rinse canned vegetables prior to cooking and do not add any additional salt to meals.

## 2023-10-26 NOTE — Assessment & Plan Note (Signed)
Diabetes longstanding currently uncontrolled. Patient is able to verbalize appropriate hypoglycemia management plan. Medication adherence appears poor. Control is suboptimal due to medication non-adherence due to cost issues and patient confusion on regimen. -Increased insulin Lantus (insulin glargine) from patient reported 10 units 1-2x daily to 25 units once daily -Plan to call in ~1 week to consider additional increase to insulin regimen  -Restarted Ozempic (semaglutide) 0.25 mg once weekly (Tuesdays) -Educated patient on differences between insulin and semaglutide, and importance of adherence to both  Patient educated on purpose, proper use, and potential adverse effects.  -Extensively discussed pathophysiology of diabetes, recommended lifestyle interventions, dietary effects on blood sugar control.

## 2023-10-27 LAB — BASIC METABOLIC PANEL
BUN/Creatinine Ratio: 9 (ref 9–20)
BUN: 8 mg/dL (ref 6–20)
CO2: 20 mmol/L (ref 20–29)
Calcium: 9.7 mg/dL (ref 8.7–10.2)
Chloride: 98 mmol/L (ref 96–106)
Creatinine, Ser: 0.93 mg/dL (ref 0.76–1.27)
Glucose: 188 mg/dL — ABNORMAL HIGH (ref 70–99)
Potassium: 4.3 mmol/L (ref 3.5–5.2)
Sodium: 135 mmol/L (ref 134–144)
eGFR: 113 mL/min/{1.73_m2} (ref >59)

## 2023-10-29 NOTE — Progress Notes (Signed)
Reviewed and agree with Dr Koval's plan.   

## 2023-11-05 ENCOUNTER — Telehealth: Payer: Self-pay | Admitting: Pharmacist

## 2023-11-05 ENCOUNTER — Ambulatory Visit: Payer: BLUE CROSS/BLUE SHIELD | Admitting: Pharmacist

## 2023-11-05 NOTE — Telephone Encounter (Signed)
Patient contacted to reschedule missed appointment on 11/05/23 for LIBERATE study follow-up, discussion of CGM results, and medication management.   Left message indicating front desk as call-back to reschedule.

## 2023-11-25 ENCOUNTER — Encounter: Payer: Self-pay | Admitting: Family Medicine

## 2023-11-27 ENCOUNTER — Telehealth: Payer: Self-pay | Admitting: Pharmacist

## 2023-11-27 DIAGNOSIS — E119 Type 2 diabetes mellitus without complications: Secondary | ICD-10-CM

## 2023-11-27 MED ORDER — INSULIN GLARGINE 100 UNIT/ML SOLOSTAR PEN: 14.0000 [IU] | PEN_INJECTOR | Freq: Every day | SUBCUTANEOUS | Status: DC

## 2023-11-27 MED ORDER — OZEMPIC (0.25 OR 0.5 MG/DOSE) 2 MG/1.5ML ~~LOC~~ SOPN: 0.5000 mg | PEN_INJECTOR | SUBCUTANEOUS | Status: DC

## 2023-11-27 NOTE — Assessment & Plan Note (Signed)
Patient contacted for follow-up of Liberate Study 3 month visit plan.   Since last contact patient reports he is doing better and I agreed based on his CGM report.  He reports taking Lantus (insulin glargine) 10 or 12 units daily and Ozempic (semaglutide) 0.25mg  every Thursday. Patient denies any significant medication related side effects.  Most recent two weeks - GMI = 8.5  Glucose Average 217  Medication Plan: -Increased dose of Lantus from 10-12 to 14 units ONCE daily - instructed to take this dose and not adjust with size of meals as this is not a mealtime insuln.  -Increased dose of  Ozempic (semaglutide) from 0.25mg  to 0.5mg  once weekly starting with next dose in two days (Thursday)

## 2023-11-27 NOTE — Telephone Encounter (Signed)
Patient contacted for follow-up of Liberate Study 3 month visit plan.   Since last contact patient reports he is doing better and I agreed based on his CGM report.  He reports taking Lantus (insulin glargine) 10 or 12 units daily and Ozempic (semaglutide) 0.25mg  every Thursday. Patient denies any significant medication related side effects.  Most recent two weeks - GMI = 8.5  Glucose Average 217  Medication Plan: -Increased dose of Lantus from 10-12 to 14 units ONCE daily - instructed to take this dose and not adjust with size of meals as this is not a mealtime insuln.  -Increased dose of  Ozempic (semaglutide) from 0.25mg  to 0.5mg  once weekly starting with next dose in two days (Thursday)  Patient agreed to visit in follow-up immediately prior to next PCP visit on 12/20 I will plan to see him at 9:00 (research visit - free A1C) followed by PCP visit at 9:30  Total time with patient call and documentation of interaction: 17 minutes.  F/U Phone call planned: None

## 2023-11-28 ENCOUNTER — Ambulatory Visit: Payer: BLUE CROSS/BLUE SHIELD | Admitting: Student

## 2023-11-28 NOTE — Telephone Encounter (Signed)
Reviewed and agree with Dr Koval's plan.   

## 2023-12-07 ENCOUNTER — Encounter: Payer: Self-pay | Admitting: Pharmacist

## 2023-12-07 ENCOUNTER — Ambulatory Visit (INDEPENDENT_AMBULATORY_CARE_PROVIDER_SITE_OTHER): Payer: BLUE CROSS/BLUE SHIELD | Admitting: Family Medicine

## 2023-12-07 ENCOUNTER — Encounter (INDEPENDENT_AMBULATORY_CARE_PROVIDER_SITE_OTHER): Payer: BLUE CROSS/BLUE SHIELD | Admitting: Pharmacist

## 2023-12-07 VITALS — BP 136/88 | HR 86 | Wt 348.0 lb

## 2023-12-07 DIAGNOSIS — E119 Type 2 diabetes mellitus without complications: Secondary | ICD-10-CM

## 2023-12-07 DIAGNOSIS — M2140 Flat foot [pes planus] (acquired), unspecified foot: Secondary | ICD-10-CM | POA: Insufficient documentation

## 2023-12-07 DIAGNOSIS — M793 Panniculitis, unspecified: Secondary | ICD-10-CM | POA: Diagnosis not present

## 2023-12-07 DIAGNOSIS — I10 Essential (primary) hypertension: Secondary | ICD-10-CM

## 2023-12-07 DIAGNOSIS — G4733 Obstructive sleep apnea (adult) (pediatric): Secondary | ICD-10-CM

## 2023-12-07 DIAGNOSIS — R519 Headache, unspecified: Secondary | ICD-10-CM | POA: Insufficient documentation

## 2023-12-07 DIAGNOSIS — M2141 Flat foot [pes planus] (acquired), right foot: Secondary | ICD-10-CM

## 2023-12-07 LAB — POCT GLYCOSYLATED HEMOGLOBIN (HGB A1C): HbA1c, POC (controlled diabetic range): 10.1 % — AB (ref 0.0–7.0)

## 2023-12-07 MED ORDER — SEMGLEE (YFGN) 100 UNIT/ML ~~LOC~~ SOPN: 16.0000 [IU] | PEN_INJECTOR | Freq: Every day | SUBCUTANEOUS | 11 refills | Status: DC

## 2023-12-07 NOTE — Progress Notes (Signed)
SUBJECTIVE:   CHIEF COMPLAINT / HPI:   T2DM Has been following with Dr. Raymondo Band in the liberate study.  Followed up before this visit with an updated A1c of 10.1, down from 11.5.  He has been taking Lantus 10-14 units once daily along with semaglutide 0.5 mg weekly.  He continues to take atorvastatin 40 mg daily.  Hypertension Dr. Raymondo Band recently increased medication regimen to lisinopril 20 mg daily.  Has been taking without difficulty.  Lipodermatosclerosis of right lower extremity Has been previously followed.  At last visit 08/23/2023, he was recommended to use compression along with triamcinolone 0.1% ointment twice daily for skin lightening.  Also recommended lifestyle interventions and controlling diabetes.  Headaches Intermittent, about 2 times per week.  On the right side.  Has some visual changes with these include blurriness.  No numbness, tingling, motor changes with these.  No nausea or vomiting.  Feels like he has to turn the brightness on his phone down because it hurts his eyes. Feels like he is sleeping better, but he needs a new sleep study.  Feels like he tries to stay hydrated.  Has not been able to see the optometrist for his eyesight.  PERTINENT  PMH / PSH: Venous stasis, myxomatous degeneration of mitral valve, LVH, HLD, morbid obesity  OBJECTIVE:   BP 136/88   Pulse 86   Wt (!) 348 lb (157.9 kg)   SpO2 96%   BMI 54.50 kg/m   General: Alert and oriented, in NAD Skin: Warm, dry, and intact; right lower extremity with persistent hyperpigmented changes without active draining or weeping HEENT: NCAT, EOM grossly normal, midline nasal septum Cardiac: Regular rate Respiratory: Breathing and speaking comfortably on RA Extremities: Moves all extremities grossly equally, pes planus of right foot as pictured below Neurological: No gross focal deficit Psychiatric: Appropriate mood and affect     ASSESSMENT/PLAN:   Hypertension BP closer to goal of less than  130/80.  Continue lisinopril 20 mg daily for now.  Will follow-up in 1 month and likely switch to combination losartan-HCTZ if still elevated.  Obstructive sleep apnea Refer to sleep medicine for repeat sleep study given last study multiple years ago.  Type 2 diabetes mellitus without complication, without long-term current use of insulin (HCC) A1c 10.1 today.  Congratulated on progress.  Advised to continue Lantus 14 units daily along with semaglutide 0.5 mg weekly.  Will follow-up in 1 month and likely increase semaglutide to 1 mg.  He also has a follow-up with Dr. Raymondo Band who will follow-up with CGM monitoring and provide further recommendations.  Lipodermatosclerosis of right lower extremity Persistent.  Has unfortunately not been able to use the steroid cream, moisturizers, compression stockings consistently.  Discussed importance of daily use of these agents to help reduce irritation and venous stasis.  Follow-up progress in 1 month.  Pes planus As evidenced in photo above.  With history of diabetes, consider some element of diabetic neuropathy.  He has seen podiatry in the past.  Will likely need follow-up with them again in the future to prevent Charcot arthropathy.  Frequent headaches Occurring every 2 days.  Reassured no sensory or motor changes with these.  However, visual changes in conjunction with unilateral pounding headache could be migraines.  Discussed first increasing water intake in case he is dehydrated, following up with optometry in case vision is contributing, and taking Tylenol 1000 mg with ibuprofen 600 mg as needed for headaches.  If persistent, can consider Ubrelvy or lasmiditan for migraine  abortives given vasoconstrictive effects of triptans with his history of cardiac conditions.   Janeal Holmes, MD [facility]

## 2023-12-07 NOTE — Assessment & Plan Note (Addendum)
As evidenced in photo above.  With history of diabetes, consider some element of diabetic neuropathy.  He has seen podiatry in the past.  Will likely need follow-up with them again in the future to prevent Charcot arthropathy.

## 2023-12-07 NOTE — Research (Signed)
S:     Chief Complaint  Patient presents with   Medication Management    Liberate - 3 Month CGM   32 y.o. male who presents for diabetes evaluation, education, and management in the context of the LIBERATE Study.  PMH is significant for Diabetes.  Patient was referred and last seen by Primary Care Provider, Dr. Phineas Real, on 08/23/2023 and also will see Dr. Phineas Real today following my visit. .   At last visit with me, 10/26/2023, insulin regimen was adjusted and Ozempic initiated.  Over the phone his Ozempic dose has been increased to 0.5mg  weekly and patient reports no side effects or intolerance.    Today, patient arrives in good spirits and presents without any assistance.  No sensor use X 2 weeks due to running out of CGM sensors.  Patient reports Diabetes was diagnosed in 2020.   Current diabetes medications include: insulin Lantus (insulin glargine) 10-15 units per day, metformin XR 750 mg BID, semaglutide (Ozempic) 0.5 mg reports taking once weekly    Do you feel that your medications are working for you? yes Have you been experiencing any side effects to the medications prescribed? no Insurance coverage: BCBS / Medicaid  Patient denies hypoglycemic events.     Patient denies nocturia (nighttime urination).  Patient reports improvement in previous visual changes. Patient reports self foot/lower leg exam remains unchanged.    O:   Review of Systems  Eyes:  Positive for blurred vision (remains but improved (lessened)).  Cardiovascular:  Positive for leg swelling (bilateral with discoloration).  Neurological:  Positive for headaches (2x per month).    Physical Exam Constitutional:      Appearance: Normal appearance.  Pulmonary:     Effort: Pulmonary effort is normal.  Neurological:     Mental Status: He is alert.  Psychiatric:        Mood and Affect: Mood normal.        Behavior: Behavior normal.        Thought Content: Thought content normal.   Libre3 CGM Download  today from November Due to running out of sensors   Lab Results  Component Value Date   HGBA1C 10.1 (A) 12/07/2023     A/P:  LIBERATE Study:  - 8 sensors provided for a 3 month supply. Educated to contact the office if the sensor falls off early and replacements are needed before their next [employer].  Reviewed techniques to improve duration of use of each sensor including skin tac and tega-derm.  Majority of visit focused on new sensor application and cgm reconnectton.   Diabetes longstanding currently improved but remains uncontrolled. Patient is able to verbalize appropriate hypoglycemia management plan. Medication adherence appears improved.  -Increased dose of basal insulin Semglee (insulin glargine) 10-15 units units once daily to 16 units daily without adjustment or sliding scale.  -Continued GLP-1 Ozempic (semaglutide) at 0.5mg  weekly.  New prescription provided.  -Continued metformin 750 mg ONCE daily.  -Patient educated on purpose, proper use, and potential adverse effects.  -Extensively discussed pathophysiology of diabetes, recommended lifestyle interventions, dietary effects on blood sugar control.  -Counseled on s/sx of and management of hypoglycemia.  -Next A1c anticipated 3months (end of liberate study).   Hypertension deferred to PCP, Dr Phineas Real who is also seeing patient today.  Written patient instructions provided. Patient verbalized understanding of treatment plan.  Total time in face to face counseling 29 minutes.    Follow-up:  Pharmacist 1-2 months likely and also 3 months for  end of Liberate study.

## 2023-12-07 NOTE — Assessment & Plan Note (Signed)
LIBERATE Study:  - 8 sensors provided for a 3 month supply. Educated to contact the office if the sensor falls off early and replacements are needed before their next [employer].  Reviewed techniques to improve duration of use of each sensor including skin tac and tega-derm.  Majority of visit focused on new sensor application and cgm reconnectton.   Diabetes longstanding currently improved but remains uncontrolled. Patient is able to verbalize appropriate hypoglycemia management plan. Medication adherence appears improved.  -Increased dose of basal insulin Semglee (insulin glargine) 10-15 units units once daily to 16 units daily without adjustment or sliding scale.  -Continued GLP-1 Ozempic (semaglutide) at 0.5mg  weekly.  New prescription provided.  -Continued metformin 750 mg ONCE daily.  -Patient educated on purpose, proper use, and potential adverse effects.  -Extensively discussed pathophysiology of diabetes, recommended lifestyle interventions, dietary effects on blood sugar control.  -Counseled on s/sx of and management of hypoglycemia.  -Next A1c anticipated 3months (end of liberate study).

## 2023-12-07 NOTE — Assessment & Plan Note (Signed)
Refer to sleep medicine for repeat sleep study given last study multiple years ago.

## 2023-12-07 NOTE — Assessment & Plan Note (Signed)
A1c 10.1 today.  Congratulated on progress.  Advised to continue Lantus 14 units daily along with semaglutide 0.5 mg weekly.  Will follow-up in 1 month and likely increase semaglutide to 1 mg.  He also has a follow-up with Dr. Raymondo Band who will follow-up with CGM monitoring and provide further recommendations.

## 2023-12-07 NOTE — Assessment & Plan Note (Signed)
BP closer to goal of less than 130/80.  Continue lisinopril 20 mg daily for now.  Will follow-up in 1 month and likely switch to combination losartan-HCTZ if still elevated.

## 2023-12-07 NOTE — Patient Instructions (Signed)
It was nice to see you today! Medication Changes: START CGM sensor use again.   Increase Semglee to 16 units daily  Continue all other medication the same.

## 2023-12-07 NOTE — Patient Instructions (Addendum)
Continue Lantus 14 units daily with semaglutide 0.5 mg weekly.  Follow-up in 1 month to see how you are doing and likely increase semaglutide. Continue taking your lisinopril 20 mg daily.  We could think about going up on medication if needed in the future For your leg, try to use compression stockings anytime your legs are not elevated.  Also be sure to apply the triamcinolone steroid cream twice a day every day.  Let me know if you need a refill of this. I will see about getting you your sleep study and also your optometrist. Make sure you stay hydrated (targeting a clear-yellow urine) and taking Tylenol 1000 mg with ibuprofen 600 mg with headaches.  The optometrist should be able to rule out and vision because of these headaches.  If persistent or if accompanied by numbness or weakness in your body, please let me know or return to care.

## 2023-12-07 NOTE — Assessment & Plan Note (Signed)
Occurring every 2 days.  Reassured no sensory or motor changes with these.  However, visual changes in conjunction with unilateral pounding headache could be migraines.  Discussed first increasing water intake in case he is dehydrated, following up with optometry in case vision is contributing, and taking Tylenol 1000 mg with ibuprofen 600 mg as needed for headaches.  If persistent, can consider Ubrelvy or lasmiditan for migraine abortives given vasoconstrictive effects of triptans with his history of cardiac conditions.

## 2023-12-07 NOTE — Assessment & Plan Note (Signed)
Persistent.  Has unfortunately not been able to use the steroid cream, moisturizers, compression stockings consistently.  Discussed importance of daily use of these agents to help reduce irritation and venous stasis.  Follow-up progress in 1 month.

## 2024-01-14 ENCOUNTER — Ambulatory Visit: Payer: BLUE CROSS/BLUE SHIELD | Admitting: Family Medicine

## 2024-02-03 ENCOUNTER — Encounter (HOSPITAL_BASED_OUTPATIENT_CLINIC_OR_DEPARTMENT_OTHER): Payer: Self-pay

## 2024-02-03 ENCOUNTER — Encounter: Payer: Self-pay | Admitting: Family Medicine

## 2024-02-03 ENCOUNTER — Ambulatory Visit (HOSPITAL_BASED_OUTPATIENT_CLINIC_OR_DEPARTMENT_OTHER): Payer: BLUE CROSS/BLUE SHIELD | Attending: Family Medicine | Admitting: Internal Medicine

## 2024-02-11 ENCOUNTER — Telehealth: Payer: Self-pay | Admitting: Pharmacist

## 2024-02-11 MED ORDER — SEMGLEE (YFGN) 100 UNIT/ML ~~LOC~~ SOPN: 20.0000 [IU] | PEN_INJECTOR | Freq: Every day | SUBCUTANEOUS | Status: DC

## 2024-02-11 NOTE — Telephone Encounter (Signed)
 Patient contacted for follow-up of CGM results and Diabetes control  Since last contact patient reports he has consistently seen numbers of > 200 His GMI is now 10.1  Current Medications include:  insulin glargine 10-14 units depending on "what he eats"   And  Ozempic (semaglutide) 0.5mg  weekly.  Patient denies any significant medication related side effects. Minimal readings< 170mg /dl  Medication Plan: -Continue Ozempic - same dose  -Increase insulin glargine to 20 units once daily - asked him to take the same dose every day in the AM.    Total time with patient call and documentation of interaction: 12 minutes.  F/U PCP 02/20/2024

## 2024-02-11 NOTE — Telephone Encounter (Signed)
 Attempted to contact patient for follow-up of LIberate CGM review Unfortunately patient unavailable at only number on file.    Left HIPAA compliant voice mail requesting call back to direct phone: [phone]  Total time with patient call and documentation of interaction: 8 minutes.  PCP visit 02/22/2024

## 2024-02-11 NOTE — Addendum Note (Signed)
 Addended by: Kathrin Ruddy on: 02/11/2024 04:22 PM   Modules accepted: Orders

## 2024-02-12 NOTE — Telephone Encounter (Signed)
 Reviewed and agree with Dr Macky Lower plan.

## 2024-02-20 ENCOUNTER — Ambulatory Visit: Payer: BLUE CROSS/BLUE SHIELD | Admitting: Family Medicine

## 2024-02-21 ENCOUNTER — Ambulatory Visit (HOSPITAL_BASED_OUTPATIENT_CLINIC_OR_DEPARTMENT_OTHER): Payer: BLUE CROSS/BLUE SHIELD | Attending: Family Medicine | Admitting: Internal Medicine

## 2024-02-21 ENCOUNTER — Encounter (HOSPITAL_BASED_OUTPATIENT_CLINIC_OR_DEPARTMENT_OTHER): Payer: Self-pay

## 2024-03-03 ENCOUNTER — Telehealth: Payer: Self-pay | Admitting: Pharmacist

## 2024-03-03 MED ORDER — SEMGLEE (YFGN) 100 UNIT/ML ~~LOC~~ SOPN: 16.0000 [IU] | PEN_INJECTOR | Freq: Every day | SUBCUTANEOUS | Status: DC

## 2024-03-03 NOTE — Telephone Encounter (Addendum)
 Patient contacted for follow-up of diabetes outreach  Since last contact patient reports improved A1C of 10.1. Reports elevated glucose levels at home with readings as high as 300s.   Current Medications include: metformin 750 mg daily, semaglutide (Ozempic) 0.5 mg weekly (no use for 2-3 weeks), insulin glargine 12 units daily  Patient denies any significant medication related side effects.  Medication Plan: - Increase insulin glargine (Lantus) from 12 units to 16 units daily   Total time with patient call and documentation of interaction: 7 minutes.

## 2024-03-04 NOTE — Telephone Encounter (Signed)
 Reviewed and agree with Dr Macky Lower plan.

## 2024-03-07 ENCOUNTER — Telehealth: Payer: Self-pay | Admitting: Pharmacist

## 2024-03-07 NOTE — Telephone Encounter (Signed)
 Attempted to contact patient for follow-up of Diabetes control.  Patient is enrolled in Liberate CGM study and appears to be in poor control on his Libre3 CGM.    Attempted calls X2  Left HIPAA compliant voice mail requesting call back to direct phone: [phone]  OR  if possible schedule return to office visit with me or PCP at [facility].   Total time with patient call and documentation of interaction: 9 minutes.

## 2024-03-10 ENCOUNTER — Encounter: Payer: Self-pay | Admitting: Pharmacist

## 2024-03-10 ENCOUNTER — Other Ambulatory Visit (HOSPITAL_COMMUNITY): Payer: Self-pay

## 2024-03-10 ENCOUNTER — Telehealth: Payer: Self-pay

## 2024-03-10 ENCOUNTER — Ambulatory Visit (INDEPENDENT_AMBULATORY_CARE_PROVIDER_SITE_OTHER): Admitting: Pharmacist

## 2024-03-10 VITALS — BP 128/85 | HR 81 | Wt 352.8 lb

## 2024-03-10 DIAGNOSIS — I10 Essential (primary) hypertension: Secondary | ICD-10-CM

## 2024-03-10 DIAGNOSIS — E119 Type 2 diabetes mellitus without complications: Secondary | ICD-10-CM

## 2024-03-10 LAB — POCT GLYCOSYLATED HEMOGLOBIN (HGB A1C): HbA1c, POC (controlled diabetic range): 11.5 % — AB (ref 0.0–7.0)

## 2024-03-10 MED ORDER — OLMESARTAN MEDOXOMIL 20 MG PO TABS: 20.0000 mg | ORAL_TABLET | Freq: Every day | ORAL | 3 refills | Status: DC

## 2024-03-10 MED ORDER — PEN NEEDLES 32G X 5 MM MISC: 1.0000 | 99 refills | Status: AC | PRN

## 2024-03-10 MED ORDER — METFORMIN HCL ER 750 MG PO TB24: 1500.0000 mg | ORAL_TABLET | Freq: Every day | ORAL | 5 refills | Status: DC

## 2024-03-10 MED ORDER — SEMGLEE (YFGN) 100 UNIT/ML ~~LOC~~ SOPN: 14.0000 [IU] | PEN_INJECTOR | Freq: Every day | SUBCUTANEOUS | 5 refills | Status: DC

## 2024-03-10 MED ORDER — OZEMPIC (0.25 OR 0.5 MG/DOSE) 2 MG/1.5ML ~~LOC~~ SOPN: 0.5000 mg | PEN_INJECTOR | SUBCUTANEOUS | 6 refills | Status: DC

## 2024-03-10 NOTE — Progress Notes (Signed)
 Reviewed and agree with Dr Macky Lower plan.

## 2024-03-10 NOTE — Assessment & Plan Note (Signed)
 Hypertension longstanding currently slightly above goal. Blood pressure goal of <130/80 mmHg. Medication adherence poor. Patient reports not needing to take lisinopril. Blood pressure control is suboptimal due to non-adherence. -Discontinued lisinopril -Started olmesartan 20 mg

## 2024-03-10 NOTE — Patient Instructions (Signed)
 It was nice to see you today!  Your goal blood sugar is 80-130 before eating and less than 180 after eating.  Medication Changes: START Ozempic 0.25 mg once weekly for 2 weeks, then increase up to 0.5 mg  Increase metformin XR 750 mg from 1 to 2 tablets once daily in the morning  Increase Semglee 2 units daily from 12 to 50 units  START olmesartan 20 mg daily  Discontinue lisinopril   Continue all other medication the same.   Keep up the good work with diet and exercise. Aim for a diet full of vegetables, fruit and lean meats (chicken, [place], fish). Try to limit salt intake by eating fresh or frozen vegetables (instead of canned), rinse canned vegetables prior to cooking and do not add any additional salt to meals.

## 2024-03-10 NOTE — Assessment & Plan Note (Signed)
 Diabetes longstanding 5 years (2020) currently uncontrolled. Patient is able to verbalize appropriate hypoglycemia management plan. Medication adherence appears poor. Patient is currently unable to take semaglutide (Ozempic) due to cost - last dose reported 1 to 2 months ago. Control is suboptimal due to financial burden in affording medication. -Increased dose of basal insulin Semglee (insulin glargine) from 12 to 50 units daily. Patient will slowly titrate 2 units every day until he reaches 50 units daily.  -Restarted GLP-1 semaglutide (Ozempic) 0.25 mg weekly for 2 weeks, then titrate up to 0.5 mg weekly.  -Increased dose of metformin XR 750 mg from 1 to 2 tablets daily in the morning.  -Patient educated on purpose, proper use, and potential adverse effects.  -Extensively discussed pathophysiology of diabetes, recommended lifestyle interventions, dietary effects on blood sugar control.

## 2024-03-10 NOTE — Telephone Encounter (Signed)
 Pharmacy Patient Advocate Encounter   Received notification from CoverMyMeds that prior authorization for [facility] is required/requested.   Insurance verification completed.   The patient is insured through [facility] .   PA required; PA submitted to above mentioned insurance via CoverMyMeds Key/confirmation #/EOC [id number]. Status is pending

## 2024-03-10 NOTE — Progress Notes (Signed)
 S:     Chief Complaint  Patient presents with   Medication Management    Diabetes - Liberate 3 mo   33 y.o. male who presents for diabetes evaluation, education, and management in the context of the LIBERATE Study - 3 month.   PMH is significant for HTN, T2DM, HLD, morbid obesity.  Patient was referred and last seen by Primary Care Provider, Dr. Phineas Real, on 12/06/24.   At last visit, patient advised to continue insulin glargine (Semglee) 14 units daily and semaglutide (Ozempic) 0.5 mg weekly as prescribed. Last week on 3/17, insulin glargine (Semglee) was further to 16 units daily over telephone encounter.  Today, patient arrives in good spirits and presents without any assistance.   Patient reports Diabetes was diagnosed in ~3-5 years ago.   Current diabetes medications include: metformin XR 750 mg, semaglutide (Ozempic) 0.5 mg (not taking), insulin glargine (Semglee) 12-16 units daily Current hypertension medications include: lisinopril 10 mg (not taking) Current hyperlipidemia medications include: atorvastatin 40 mg  Patient denies adherence with medications, reports missing metformin (evening dose), lisinopril, and semaglutide (Ozempic) medications. Patient reports not needing to take lisinopril, and being unable to take Ozempic due to issues with cost, possibly due to recent change in insurance.  Do you have any problems obtaining medications due to transportation or finances? yes Insurance coverage: Tourist information centre manager, [place] [employer]  O:   Review of Systems  All other systems reviewed and are negative.  Libre3 CGM Download today 03/10/24 % Time CGM is active: 26% Average Glucose: 352 mg/dL Glucose Management Indicator: --  Glucose Variability: 11% (goal <36%) Time in Goal:  - Time in range 70-180: 0% - Time above range: 100% - Time below range: 0% Observed patterns:  Physical Exam Constitutional:      Appearance: Normal appearance.  Pulmonary:     Effort:  Pulmonary effort is normal.  Neurological:     Mental Status: He is alert.  Psychiatric:        Mood and Affect: Mood normal.        Behavior: Behavior normal.        Thought Content: Thought content normal.        Judgment: Judgment normal.     Lab Results  Component Value Date   HGBA1C 11.5 (A) 03/10/2024    Vitals:   03/10/24 0843  BP: 128/85  Pulse: 81  SpO2: 98%     Lipid Panel     Component Value Date/Time   CHOL 239 (H) 08/23/2023 1652   TRIG 835 (HH) 08/23/2023 1652   HDL 25 (L) 08/23/2023 1652   CHOLHDL 9.6 (H) 08/23/2023 1652   LDLCALC Comment (A) 08/23/2023 1652   LDLDIRECT 144 (H) 12/14/2021 1122   Patient is participating in a Managed Medicaid Plan:  Yes   A/P:  LIBERATE Study:  - 2 sensors provided for a 1 month supply. Educated to contact the office if the sensor falls off early and replacements are needed before their next [employer].   Diabetes longstanding 5 years (2020) currently uncontrolled. Patient is able to verbalize appropriate hypoglycemia management plan. Medication adherence appears poor. Patient is currently unable to take semaglutide (Ozempic) due to cost - last dose reported 1 to 2 months ago. Control is suboptimal due to financial burden in affording medication. -Increased dose of basal insulin Semglee (insulin glargine) from 12 to 50 units daily. Patient will slowly titrate 2 units every day until he reaches 50 units daily.  -Restarted GLP-1 semaglutide (Ozempic)  0.25 mg weekly for 2 weeks, then titrate up to 0.5 mg weekly.  -Increased dose of metformin XR 750 mg from 1 to 2 tablets daily in the morning.  -Patient educated on purpose, proper use, and potential adverse effects.  -Extensively discussed pathophysiology of diabetes, recommended lifestyle interventions, dietary effects on blood sugar control.  -Counseled on s/sx of and management of hypoglycemia.  -Next A1c anticipated June 2025.   ASCVD risk - primary prevention in  patient with diabetes. Last LDL is 144, not at goal of <70 mg/dL. ASCVD risk factors include HTN, T2DM, HLD, obesity. High intensity statin indicated.  -Continued atorvastatin 40 mg  Hypertension longstanding currently slightly above goal. Blood pressure goal of <130/80 mmHg. Medication adherence poor. Patient reports not needing to take lisinopril. Blood pressure control is suboptimal due to non-adherence. -Discontinued lisinopril -Started olmesartan 20 mg  Written patient instructions provided. Patient verbalized understanding of treatment plan.  Total time in face to face counseling 42 minutes.    Follow-up:  Pharmacist Dr Raymondo Band 04/10/24. PCP clinic visit PRN.  Patient seen with Threasa Heads, PharmD Candidate and Mack Guise, PharmD Candidate. Marland Kitchen

## 2024-03-11 NOTE — Telephone Encounter (Signed)
 Pharmacy Patient Advocate Encounter  Received notification from [facility] that Prior Authorization for [facility] has been APPROVED from 03/11/24 to 03/11/25

## 2024-04-10 ENCOUNTER — Ambulatory Visit: Admitting: Pharmacist

## 2024-04-21 ENCOUNTER — Encounter: Payer: Self-pay | Admitting: Family Medicine

## 2024-04-21 NOTE — Telephone Encounter (Signed)
 Called patient.   Wheezing  present for last 2 days. He reports having "bad allergies." No other sick symptoms.   Speaking in complete sentences.   Wife reports choking while sleeping nightly for months. Patient reports having sleep study in the past with results consistent with sleep apnea, however, never received CPAP. Per chart review, sleep study was in 2021. He would need another sleep study in order to determine appropriate titration and settings for machine. Patient is requesting home sleep study if possible.   Advised that best next step for this concern would be to schedule follow up visit. Offered next available appointment, patient elects to see Dr. Fredrik Jensen on Wednesday.   ED precautions discussed.   Elsie Halo, RN

## 2024-04-23 ENCOUNTER — Ambulatory Visit: Admitting: Family Medicine

## 2024-05-21 ENCOUNTER — Encounter: Payer: Self-pay | Admitting: Family Medicine

## 2024-06-03 NOTE — Progress Notes (Incomplete)
    SUBJECTIVE:   CHIEF COMPLAINT / HPI:   T2DM Previously saw Dr. Koval on 03/10/24. Insulin  was increased to 50u daily LAI. He was started on ozempic  0.25 mg weekly with titration to 0.5 mg weekly. He was also increased to metformin  XR 750 mg two tablet daily.  Lipodermatosclerosis Previously discussed using moisturizers, steroid creams, and compression stockings consistently.  HTN He was switched from lisinopril  to olmesartan  20 mg at last visit with Dr. Koval.  PERTINENT  PMH / PSH: ***  OBJECTIVE:   There were no vitals taken for this visit.  ***  ASSESSMENT/PLAN:   Assessment & Plan      Fred Kenning, MD [facility]

## 2024-06-04 ENCOUNTER — Ambulatory Visit: Admitting: Family Medicine

## 2024-06-24 ENCOUNTER — Encounter: Payer: Self-pay | Admitting: Family Medicine

## 2024-06-24 DIAGNOSIS — E119 Type 2 diabetes mellitus without complications: Secondary | ICD-10-CM

## 2024-06-26 NOTE — Telephone Encounter (Signed)
 Placed a new referral for ophthalmology for patient to obtain his diabetic eye exam since unable to attend previously.  Thank so much for following up on this and getting him scheduled, Chiquita and Margit!

## 2024-07-14 ENCOUNTER — Encounter: Payer: Self-pay | Admitting: Family Medicine

## 2024-07-14 ENCOUNTER — Ambulatory Visit: Admitting: Family Medicine

## 2024-07-18 ENCOUNTER — Ambulatory Visit: Admitting: Family Medicine

## 2024-07-21 ENCOUNTER — Ambulatory Visit: Admitting: Family Medicine

## 2024-08-12 ENCOUNTER — Telehealth: Payer: Self-pay

## 2024-08-12 ENCOUNTER — Ambulatory Visit (INDEPENDENT_AMBULATORY_CARE_PROVIDER_SITE_OTHER): Admitting: Family Medicine

## 2024-08-12 VITALS — BP 132/96 | HR 97 | Wt 349.6 lb

## 2024-08-12 DIAGNOSIS — I10 Essential (primary) hypertension: Secondary | ICD-10-CM | POA: Diagnosis not present

## 2024-08-12 DIAGNOSIS — E1169 Type 2 diabetes mellitus with other specified complication: Secondary | ICD-10-CM

## 2024-08-12 DIAGNOSIS — G4733 Obstructive sleep apnea (adult) (pediatric): Secondary | ICD-10-CM

## 2024-08-12 DIAGNOSIS — E785 Hyperlipidemia, unspecified: Secondary | ICD-10-CM

## 2024-08-12 DIAGNOSIS — M793 Panniculitis, unspecified: Secondary | ICD-10-CM

## 2024-08-12 DIAGNOSIS — E119 Type 2 diabetes mellitus without complications: Secondary | ICD-10-CM | POA: Diagnosis not present

## 2024-08-12 DIAGNOSIS — I3489 Other nonrheumatic mitral valve disorders: Secondary | ICD-10-CM

## 2024-08-12 DIAGNOSIS — R519 Headache, unspecified: Secondary | ICD-10-CM

## 2024-08-12 DIAGNOSIS — I517 Cardiomegaly: Secondary | ICD-10-CM

## 2024-08-12 LAB — POCT GLYCOSYLATED HEMOGLOBIN (HGB A1C): HbA1c, POC (controlled diabetic range): 11.2 % — AB (ref 0.0–7.0)

## 2024-08-12 MED ORDER — TRIAMCINOLONE ACETONIDE 0.1 % EX OINT: 1.0000 | TOPICAL_OINTMENT | Freq: Two times a day (BID) | CUTANEOUS | 0 refills | Status: DC

## 2024-08-12 MED ORDER — GLUCOSE BLOOD VI STRP: ORAL_STRIP | 12 refills | Status: AC

## 2024-08-12 MED ORDER — ATORVASTATIN CALCIUM 40 MG PO TABS: 40.0000 mg | ORAL_TABLET | Freq: Every day | ORAL | 3 refills | Status: AC

## 2024-08-12 MED ORDER — METFORMIN HCL ER 750 MG PO TB24: 1500.0000 mg | ORAL_TABLET | Freq: Every day | ORAL | 5 refills | Status: AC

## 2024-08-12 MED ORDER — FREESTYLE LIBRE 3 PLUS SENSOR MISC: 11 refills | Status: AC

## 2024-08-12 MED ORDER — OLMESARTAN MEDOXOMIL-HCTZ 20-12.5 MG PO TABS: 1.0000 | ORAL_TABLET | Freq: Every day | ORAL | 0 refills | Status: AC

## 2024-08-12 MED ORDER — OZEMPIC (0.25 OR 0.5 MG/DOSE) 2 MG/1.5ML ~~LOC~~ SOPN: 0.2500 mg | PEN_INJECTOR | SUBCUTANEOUS | 0 refills | Status: DC

## 2024-08-12 MED ORDER — LANCETS MISC: 0 refills | Status: AC

## 2024-08-12 MED ORDER — BLOOD GLUCOSE MONITORING SUPPL W/DEVICE KIT: 1.0000 | PACK | Freq: Every day | 0 refills | Status: DC

## 2024-08-12 MED ORDER — BLOOD GLUCOSE MONITORING SUPPL W/DEVICE KIT: PACK | 0 refills | Status: AC

## 2024-08-12 MED ORDER — SEMGLEE (YFGN) 100 UNIT/ML ~~LOC~~ SOPN: 14.0000 [IU] | PEN_INJECTOR | Freq: Every day | SUBCUTANEOUS | 5 refills | Status: AC

## 2024-08-12 MED ORDER — FENOFIBRATE 48 MG PO TABS: 48.0000 mg | ORAL_TABLET | Freq: Every day | ORAL | 0 refills | Status: DC

## 2024-08-12 NOTE — Progress Notes (Signed)
 S:     Chief Complaint  Patient presents with   Diabetes   33 y.o. male who presents for diabetes evaluation, education, and management. Patient arrives in good spirits and presents without any assistance.  Patient was referred and last seen by Primary Care Provider, Dr. Tharon, today 08/12/24.  Today, olmesartan  20 mg was changed to olmesartan /hydrochlorothiazide 20/12.5 mg daily. Semglee  (insulin  glargine) was changed from 20 units BID to 30 units in the morning, increasing by 2 units until glucose <150. Ozempic  (semaglutide ) 0.25 mg weekly was restarted.  PMH is significant for HTN, T2DM, HLD, morbid obesity.  Patient reports Diabetes was diagnosed in ~3-5 years ago.    Current diabetes medications include: metformin  XR 1500 mg, Semglee  (insulin  glargine) 20 units BID (titrate by 2 units until <150) Current hypertension medications include: olmesartan /hydrochlorothiazide 20/12.5 mg  Current hyperlipidemia medications include: atorvastatin  40 mg  Patient reports adherence to taking all medications as prescribed.   Patient denies hypoglycemic events.  Reports not checking glucose at home.  Patient reports nocturia (nighttime urination). Reports nocturia 4-6 times per night. Patient denies neuropathy (nerve pain).  O:   Review of Systems  All other systems reviewed and are negative.   Physical Exam Constitutional:      Appearance: Normal appearance.  Pulmonary:     Effort: Pulmonary effort is normal.  Neurological:     Mental Status: He is alert.  Psychiatric:        Mood and Affect: Mood normal.        Behavior: Behavior normal.        Thought Content: Thought content normal.        Judgment: Judgment normal.      Lab Results  Component Value Date   HGBA1C 11.2 (A) 08/12/2024   Vitals:   08/12/24 1512 08/12/24 1544  BP: (!) 149/92 (!) 132/96  Pulse: 97   SpO2: 97%     Lipid Panel     Component Value Date/Time   CHOL 239 (H) 08/23/2023 1652   TRIG  835 (HH) 08/23/2023 1652   HDL 25 (L) 08/23/2023 1652   CHOLHDL 9.6 (H) 08/23/2023 1652   LDLCALC Comment (A) 08/23/2023 1652   LDLDIRECT 144 (H) 12/14/2021 1122    Patient is participating in a Managed Medicaid Plan:  Yes   A/P: Diabetes longstanding since around 2020 currently uncontrolled with 08/12/24 A1c 11.2%. Patient is able to verbalize appropriate hypoglycemia management plan. Medication adherence appears good. Control is suboptimal due to regimen, diet and sedentary lifestyle. -Libre 3 Plus restarted today. Patient counseled on proper sensor application and CGM use. Placed sensor in clinic and connected to phone, sent prescription to pharmacy. -Semglee  (insulin  glargine) was changed from 20 units BID to 30 units in the morning, increasing by 2 units until glucose <150.  -Ozempic  (semaglutide ) 0.25 mg weekly was restarted. -Metformin  XR 750 mg 2 tab (1500 mg) every morning continued. -Patient educated on purpose, proper use, and potential adverse effects. -Extensively discussed pathophysiology of diabetes, recommended lifestyle interventions, dietary effects on blood sugar control.  -Counseled on s/sx of and management of hypoglycemia.  -A1c today. Next A1c anticipated 11/12/24. -UACR today.  ASCVD risk - primary prevention in patient with diabetes. Last (08/23/23) TG 835 mg/dL too high to calculate LDL. Last (12/14/21) LDL 144 mg/dL not at goal of <29 mg/dL. ASCVD risk factors include T2DM, hypertension, Hyperlipidemia, obesity. Tolerating atorvastatin  40 mg.  -Continue atorvastatin  40 mg daily. -Repeat fasting lipid panel. Plan to reevaluate pharmacotherapy  pending results. Consider Vascepa (icosapent ethyl) over fenofibrate  as initial agent d/t better CV outcomes. Consider initiating Vascepa 500 mg BID over 1-2g BID and follow TG trend - higher dose of Vascepa associated with increased risk of developing AFib.   Hypertension longstanding currently uncontrolled with Blood Pressure  today 149/92 mmHg and 132/96 mmHg on repeat. Blood pressure goal of <130/80 mmHg. Medication adherence appears good. Blood pressure control is suboptimal due to regimen. -Olmesartan  20 mg was changed to olmesartan /hydrochlorothiazide 20/12.5 mg daily.  -BMET today. Anticipate follow-up BMET in one week on 08/21/24 to reevaluate potassium and pressure following initiation of hydrochlorothiazide.  Written patient instructions provided. Patient verbalized understanding of treatment plan.  Total time in face to face counseling 23 minutes.    Follow-up:  Pharmacist 08/21/24 and 09/04/24 PCP clinic visit in TBD Patient seen with Fonda Blase, PharmD Candidate - PY3 student and Calton Nash, PharmD Candidate - PY4 student.

## 2024-08-12 NOTE — Telephone Encounter (Signed)
 Received call from pharmacy regarding glucose meter.   They will need updated directions to include how often patient should be testing daily.   They also need rx for lancets and testing strips.   Pharmacy tech advised that we send generic with note stating to dispense insurance preferred supplies.   I have pended supplies to encounter.   Please indicate frequency and send to pharmacy. Thanks.

## 2024-08-12 NOTE — Patient Instructions (Addendum)
 Be sure to get in contact with your eye doctor: [facility] [address], [place], [place] [address] [phone]  You will also need to call the sleep study center at: [facility] [address] [phone]  Be sure to use your glucose monitor from the pharmacy. START SEMGLEE  AT 30 UNITS AND INCREASE your Semglee  by 2 units EVERY MORNING if your glucose reading is GREATER THAN 150. START Ozempic  back at 0.25 mg weekly. CONTINUE metformin  1500 mg every day.  For your blood pressure, START olmesartan -hydrochlorothiazide 20-12.5 mg daily.  For the headaches, I would like to see if they improve after seeing the eye doctor, getting a sleep study, and getting your blood sugar and high blood pressure under control. Please be sure to call the eye doctor as above. If these continue, we can consider a prophylactic medicine.   For the leg, I recommend continuing the steroid ointment. I have refilled this. Continue compression.   I have referred you to our care managers to help you navigate your health.  Come back in 1 week to recheck this and your labs.

## 2024-08-12 NOTE — Progress Notes (Unsigned)
    SUBJECTIVE:   CHIEF COMPLAINT / HPI:   T2DM Previously seen by Dr. Koval on 03/10/24 for LIBERATE CGM study. He was placed on semglee  50 units daily along with metformin  XR 1500 mg daily and ozempic  0.5 mg weekly. He has been taking anywhere from 10-15 units BID, however. He has been taking the metformin . He has not been taking the ozempic  regularly due to family issues; last dose 2 weeks ago. Was doing good on that dose prior. He was also continued on lipitor 40 mg daily. He has an ophthalmology appointment referral pending.  HTN Started on olmesartan  20 mg daily at last appointment.  Lipodermatosclerosis Has been wearing compression stocking, but he feels that this could be causing more swelling and painful when he walks too long. He feels the triamcinolone  helped, but he felt like the leg became darker.  Headaches Continues. Has not been able to get his eyes checked. Used to be after he ate pork, but he has stopped that. Now eats mal[place] burgers. He takes tylenol  which does help well. He sometimes feels dizzy and has some blurry vision at times. He has these about twice weekly.  Likely OSA Has not been able to follow up with sleep studies.  PERTINENT  PMH / PSH: ***  OBJECTIVE:   BP (!) 149/92   Pulse 97   Wt (!) 349 lb 9.6 oz (158.6 kg)   SpO2 97%   BMI 54.76 kg/m   General: Alert and oriented, in NAD Skin: Warm, dry, and intact without lesions HEENT: NCAT, EOM grossly normal, midline nasal septum Cardiac: RRR, no m/r/g appreciated Respiratory: CTAB, breathing and speaking comfortably on RA Abdominal: Soft, nontender, nondistended, normoactive bowel sounds Extremities: Moves all extremities grossly equally Neurological: No gross focal deficit Psychiatric: Appropriate mood and affect   ASSESSMENT/PLAN:   Assessment & Plan Type 2 diabetes mellitus without complication, without long-term current use of insulin  (HCC) Hyperlipidemia associated with type 2 diabetes  mellitus (HCC)  Primary hypertension    Mobile [facility] unit: 07/12/24  Total chgolesterol: 219 HDL 34 LDL not calculated Trigl: 507 Glucose: 333 BP 164/108 HR 92 A1c 11.6  Care managers  Stuart Redo, MD [facility]

## 2024-08-13 ENCOUNTER — Ambulatory Visit: Payer: Self-pay | Admitting: Family Medicine

## 2024-08-13 ENCOUNTER — Encounter: Payer: Self-pay | Admitting: Family Medicine

## 2024-08-13 DIAGNOSIS — G4733 Obstructive sleep apnea (adult) (pediatric): Secondary | ICD-10-CM

## 2024-08-13 LAB — BASIC METABOLIC PANEL WITH GFR
BUN/Creatinine Ratio: 8 — ABNORMAL LOW (ref 9–20)
BUN: 7 mg/dL (ref 6–20)
CO2: 23 mmol/L (ref 20–29)
Calcium: 10.3 mg/dL — ABNORMAL HIGH (ref 8.7–10.2)
Chloride: 95 mmol/L — ABNORMAL LOW (ref 96–106)
Creatinine, Ser: 0.85 mg/dL (ref 0.76–1.27)
Glucose: 272 mg/dL — ABNORMAL HIGH (ref 70–99)
Potassium: 4.5 mmol/L (ref 3.5–5.2)
Sodium: 134 mmol/L (ref 134–144)
eGFR: 118 mL/min/1.73 (ref >59)

## 2024-08-13 LAB — MICROALBUMIN / CREATININE URINE RATIO
Creatinine, Urine: 145 mg/dL
Microalb/Creat Ratio: 21 mg/g{creat} (ref 0–29)
Microalbumin, Urine: 30.9 ug/mL

## 2024-08-13 NOTE — Assessment & Plan Note (Signed)
 Uncontrolled today.  We will switch olmesartan  alone to olmesartan -HCTZ 20-12.5 mg daily.  Will need repeat BMP at his visit in 1 week with his fasting lipid panel.

## 2024-08-13 NOTE — Assessment & Plan Note (Signed)
 Objectively improved since last visit.  Recommended continuing compression and triamcinolone  for further skin lightening.  We discussed extensively the inhibitive effects of uncontrolled diabetes on healing, and he will continue to work on his diabetes control as above.

## 2024-08-13 NOTE — Assessment & Plan Note (Signed)
 Continues since last visit.  Migraines remain the highest on the differential; however, his uncontrolled hyperglycemia, uncontrolled hypertension, uncorrected vision, and uncontrolled sleep apnea are likely strong contributors.  Given multiple medication changes today, we will hold off on initiating a medication like [place] (would avoid triptans given his cardiac history for now).  Continue to take over-the-counter analgesics as needed.  Will continue to reassess.

## 2024-08-13 NOTE — Progress Notes (Signed)
 BMP with elevated glucose as expected with high A1c. Cr and K normal. Recheck at next visit in 1 week given initiation of hydrochlorothiazide component for BP.

## 2024-08-13 NOTE — Addendum Note (Signed)
 Addended by: THARON LUNG B on: 08/13/2024 09:01 AM   Modules accepted: Orders

## 2024-08-13 NOTE — Assessment & Plan Note (Signed)
 He is in need of a repeat echocardiogram; I have ordered this.  He is also in need of cardiology follow-up and possible cardiac MRI for questionable HOCM.  I have sent in a repeat referral to help get him scheduled with the cardiologist for further follow-up/discussion.

## 2024-08-13 NOTE — Assessment & Plan Note (Signed)
 Very high likelihood with a high STOP-BANG score.  I have given him the number to call to schedule his sleep study.

## 2024-08-13 NOTE — Assessment & Plan Note (Addendum)
 A1c today overall stable at 11.2.  There has been significant difficulty with the medication regimen prescribed; I have referred him to our care management program to help him with this.  We discussed returning to Semglee  30 units daily and going up by 2 units every morning that his fasting blood sugar is greater than 150.  Dr. Koval helped patient get connected with CGM.  We will also restart Ozempic  at 0.25 mg weekly since he is unsure how long he has been off of this.  Continue metformin  1500 mg daily.  Continue Lipitor 40 mg daily for his HLD; he has had a persistently elevated triglyceride level on multiple lipid panels; recommend repeat this in 1 week as a fasting lab when he sees Dr. Koval.  Can consider starting Vascepa if elevated while fasting.  I have also given him the number to call the ophthalmologist to get his diabetic retinal exam.  We also collected his urine microalbumin/creatinine ratio today.

## 2024-08-19 ENCOUNTER — Encounter: Payer: Self-pay | Admitting: Family Medicine

## 2024-08-19 ENCOUNTER — Ambulatory Visit: Admitting: Pharmacist

## 2024-08-27 ENCOUNTER — Telehealth: Payer: Self-pay | Admitting: *Deleted

## 2024-08-27 NOTE — Progress Notes (Signed)
 Complex Care Management Note  Care Guide Note 08/27/2024 Name: Fred Reyes MRN: [id number] DOB: [birth year]  Fred Reyes is a 33 y.o. year old male who sees Tharon Lung, MD for primary care. I reached out to [employer] by phone today to offer complex care management services.  [name] was given information about Complex Care Management services today including:   The Complex Care Management services include support from the care team which includes your Nurse Care Manager, Clinical Social Worker, or Pharmacist.  The Complex Care Management team is here to help remove barriers to the health concerns and goals most important to you. Complex Care Management services are voluntary, and the patient may decline or stop services at any time by request to their care team member.   Complex Care Management Consent Status: Patient agreed to services and verbal consent obtained.   Follow up plan:  Telephone appointment with complex care management team member scheduled for:  9/19  Encounter Outcome:  Patient Scheduled  Harlene Satterfield  [facility]  [facility] Guide  Direct Dial: [phone]  Fax [phone]

## 2024-08-29 ENCOUNTER — Other Ambulatory Visit: Payer: Self-pay

## 2024-08-29 NOTE — Patient Instructions (Signed)
 Visit Information  Thank you for taking time to visit with me today. Please don't hesitate to contact me if I can be of assistance to you before our next scheduled appointment.  Our next appointment is by telephone on 09/11/2024 at 2pm Please call the care guide team at [phone] if you need to cancel or reschedule your appointment.    Please call the Suicide and Crisis Lifeline: 988 call the [place]  National Suicide Prevention Lifeline: 360-166-0823 or TTY: 819 112 2966 TTY 440-448-9886) to talk to a trained counselor call 1-800-273-TALK (toll free, 24 hour hotline) go to [facility] [address], [place] [phone]) call 911 if you are experiencing a Mental Health or Behavioral Health Crisis or need someone to talk to.  Patient verbalizes understanding of instructions and care plan provided today and agrees to view in MyChart. Active MyChart status and patient understanding of how to access instructions and care plan via MyChart confirmed with patient.     Orlean Fey, BSW [place]  [facility] Social Worker, [employer] [facility] [phone]

## 2024-08-29 NOTE — Patient Outreach (Signed)
 Complex Care Management   Visit Note  08/29/2024  Name:  Fred Reyes MRN: [id number] DOB: 19-Apr-1991  Situation: Referral received for Complex Care Management related to SDOH Barriers:  Transportation Housing   Food insecurity Financial Resource Strain I obtained verbal consent from Patient.  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 Patient reported that he has previously applied for disability benefits but was denied. BSW attempted to send a referral to [facility], but after speaking with their staff, they stated that the patient likely does not qualify based on his medical history. BSW will send the patient resources for food, transportation, housing, utilities, eyecare, and dental providers covered by [employer], as well as provide the phone number to [facility] and the [facility] for additional support. BSW will follow up with patient on 09/11/2024 at 2:00 PM to ensure that he has received the resources and to discuss any further needs.   Resources Provided:  Food Assistance: -Health and safety inspector ([employer]) - [address], [place], [place] [address] - [phone] -[employer]  [facility] - [address] [place], [place] [address] - [phone] -[employer] ([place]) - [address] [place], [place] [address] - [phone] -[facility] - [address] [place], [place] [address] - [phone] ext. [phone] -[address] [employer] - [address] [place], [place] [address] - [phone] ext. [phone]  Housing & Utility Assistance: -[employer] - [address], [place], [place] [address] - [phone] [facility] - [address] [place], [place] [address] - [phone] -[facility] Emergency Assistance Program - [phone] ext. [phone] Education officer, community of [place] - [address], [place], [place] [address] - [phone]  Transportation: -[employer] [place] (Paratransit) - [phone] -[employer] [place] - [phone]  Eyecare: -[facility] - [address] - [phone] -[employer] - Affordable frames & exams - [phone] -[facility]) - [phone] [address] - [phone] [facility] - [address] - [phone] -[facility] - [phone]  Dental: -[facility] - [address] [place]. - [phone] -[facility] - guilfordccn.org -[facility] ([facility]) free dental clinics - ncdentalfoundation.org -[facility] (low-cost plan) - [address] [place]. - [phone]  Disability Advocacy & Support: -[facility]ability Advocacy Center - [address] - [phone] -[facility] - [address], [place], [place] [address] - [phone]  Insurance / Plan Contact: -Tourist information centre manager [employer] -  [phone])   SDOH Interventions Today    Flowsheet Row Most Recent Value  SDOH Interventions    Food Insecurity Interventions Community Resources Provided  Housing Interventions Community Resources Provided  Transportation Interventions Community Resources Provided  Utilities Interventions Community Resources Provided  Financial Strain Interventions Community Resources Provided      Recommendation:   No recommendations at this time.  Follow Up Plan:   Telephone follow-up 09/11/2024  Fred Reyes, BSW [place]  [facility] Social Worker, [employer] [facility] [phone]

## 2024-09-01 ENCOUNTER — Ambulatory Visit (HOSPITAL_COMMUNITY)
Admission: RE | Admit: 2024-09-01 | Discharge: 2024-09-01 | Disposition: A | Discharge status: Home / Self Care | Source: Ambulatory Visit | Attending: Family Medicine | Admitting: Family Medicine

## 2024-09-01 DIAGNOSIS — I517 Cardiomegaly: Secondary | ICD-10-CM

## 2024-09-01 DIAGNOSIS — I10 Essential (primary) hypertension: Secondary | ICD-10-CM | POA: Diagnosis not present

## 2024-09-01 DIAGNOSIS — I340 Nonrheumatic mitral (valve) insufficiency: Secondary | ICD-10-CM | POA: Diagnosis present

## 2024-09-01 DIAGNOSIS — I3489 Other nonrheumatic mitral valve disorders: Secondary | ICD-10-CM

## 2024-09-01 LAB — ECHOCARDIOGRAM COMPLETE
Area-P 1/2: 3.56 cm2
Est EF: >75
S' Lateral: 2.6 cm

## 2024-09-02 NOTE — Progress Notes (Signed)
 Noted echo with normal systolic and diastolic function though with moderate LVH and hyperdynamic LV function. Concern remains per prior cardiology note for [facility]. He will need further evaluation by them and possible cardiac MRI. Provided number to call and schedule appointment as soon as possible:  [facility] [address] [place], [place] [address] [phone]

## 2024-09-04 ENCOUNTER — Ambulatory Visit: Admitting: Pharmacist

## 2024-09-05 ENCOUNTER — Other Ambulatory Visit: Payer: Self-pay | Admitting: Licensed Clinical Social Worker

## 2024-09-05 ENCOUNTER — Telehealth: Payer: Self-pay

## 2024-09-05 DIAGNOSIS — F4323 Adjustment disorder with mixed anxiety and depressed mood: Secondary | ICD-10-CM

## 2024-09-05 NOTE — Telephone Encounter (Signed)
 Called patient. He is asking to speak with Dr. Tharon regarding recent ECHO results. He also voices concerns with continued issues with his legs being swollen and difficulty standing for long periods of time.   Patient is asking if this has been documented in his chart notes and would like to know what the plan is given that he still experiences the swelling after wearing compression socks.   He states that this has been an ongoing problem for awhile.   Advised patient that I would forward message to provider with request for returned call.   Chiquita JAYSON English, RN

## 2024-09-05 NOTE — Patient Outreach (Signed)
 Patient outreached BSW today to inquire about the status of their referral to the [facility]. BSW informed patient that based on previous diagnosis , they would not be eligible for disability according to the current criteria. Patient then informed BSW of new diagnosis. BSW will submit a new referral to the servant center with the new diagnosis. Patient is scheduled for follow up on 9/25  Fred Reyes, BSW [place]  [facility] Social Worker, [employer] [facility] [phone]

## 2024-09-05 NOTE — Patient Outreach (Signed)
 Complex Care Management   Visit Note  09/05/2024  Name:  Fred Reyes MRN: [id number] DOB: 1991-01-09  Situation: Referral received for Complex Care Management related to Mental/Behavioral Health diagnosis depression and anxiety symptoms. I obtained verbal consent from Patient.  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   Thickened nails 08/23/2023   Type 2 diabetes mellitus without complication, without long-term current use of insulin  (HCC) 04/16/2019    Assessment: Patient Reported Symptoms:  Cognitive Cognitive Status: Able to follow simple commands, Alert and oriented to person, place, and time Cognitive/Intellectual Conditions Management [RPT]: None reported or documented in medical history or problem list   Health Maintenance Behaviors: Annual physical exam, Immunizations Healing Pattern: Average Health Facilitated by: Rest, Stress management  Neurological Neurological Review of Symptoms: No symptoms reported Neurological Management Strategies: Coping strategies, Routine screening Neurological Self-Management Outcome: 4 (good)  Psychosocial Psychosocial Symptoms Reported: Sadness - if selected complete PHQ 2-9 Behavioral Management Strategies: Adequate rest, Coping strategies Behavioral Health Self-Management Outcome: 3 (uncertain) Major Change/Loss/Stressor/Fears (CP): Denies Techniques to Cope with Loss/Stress/Change: Diversional activities Quality of Family Relationships: involved, helpful Do you feel physically threatened by others?: No    09/05/2024    PHQ2-9 Depression Screening   Little  interest or pleasure in doing things Not at all  Feeling down, depressed, or hopeless Several days  PHQ-2 - Total Score 1  Trouble falling or staying asleep, or sleeping too much    Feeling tired or having little energy    Poor appetite or overeating     Feeling bad about yourself - or that you are a failure or have let yourself or your family down    Trouble concentrating on things, such as reading the newspaper or watching television    Moving or speaking so slowly that other people could have noticed.  Or the opposite - being so fidgety or restless that you have been moving around a lot more than usual    Thoughts that you would be better off dead, or hurting yourself in some way    PHQ2-9 Total Score    If you checked off any problems, how difficult have these problems made it for you to do your work, take care of things at home, or get along with other people    Depression Interventions/Treatment      There were no vitals filed for this visit.  Medications Reviewed Today     Reviewed by Merlynn Lyle CROME, LCSW (Social Worker) on 09/05/24 at 1144  Med List Status: <None>   Medication Order Taking? Sig Documenting Provider Last Dose Status Informant  atorvastatin  (LIPITOR) 40 MG tablet [id number]  Take 1 tablet (40 mg total) by mouth daily. Tharon Lung, MD  Active   Blood Glucose Monitoring Suppl w/Device KIT [id number]  Check blood sugar once daily BEFORE breakfast. Tharon Lung, MD  Active   Continuous Glucose Sensor (FREESTYLE LIBRE 3 PLUS SENSOR) MISC [id number]  Change sensor every 15 days. McDiarmid, Krystal BIRCH, MD  Active   glucose blood test strip [id number]  Use to check blood sugar once daily BEFORE  breakfast. Tharon Lung, MD  Active   Insulin  Pen Needle (PEN NEEDLES) 32G X 5 MM MISC [id number]  1 Container by Does not apply route as needed. McDiarmid, Krystal BIRCH, MD  Active   Lancets MISC [id number]  Use to check blood sugar once daily BEFORE breakfast. Tharon Lung, MD  Active    metFORMIN  (GLUCOPHAGE -XR) 750 MG 24 hr tablet [id number]  Take 2 tablets (1,500 mg total) by mouth daily with breakfast. Tharon Lung, MD  Active   olmesartan -hydrochlorothiazide (BENICAR  HCT) 20-12.5 MG tablet [id number]  Take 1 tablet by mouth daily. Tharon Lung, MD  Active   Semaglutide ,0.25 or 0.5MG /DOS, (OZEMPIC , 0.25 OR 0.5 MG/DOSE,) 2 MG/1.5ML SOPN [id number]  Inject 0.25 mg into the skin once a week. Tharon Lung, MD  Active   SEMGLEE , YFGN, 100 UNIT/ML Pen [id number]  Inject 14-50 Units into the skin daily. Increase dose as instructed. Tharon Lung, MD  Active   triamcinolone  ointment (KENALOG ) 0.1 % [id number]  Apply 1 Application topically 2 (two) times daily. Apply until skin lightening is achieved.  Patient not taking: Reported on 08/12/2024   Tharon Lung, MD  Active             Recommendation:   PCP Follow-up Continue Current Plan of Care  Follow Up Plan:   Telephone follow-up in 1 month  Lyle Rung, BSW, MSW, LCSW Licensed Clinical Social Worker [employer]   [email or web address] Direct Dial: [phone]

## 2024-09-06 NOTE — Patient Instructions (Signed)
 Visit Information  Thank you for taking time to visit with me today. Please don't hesitate to contact me if I can be of assistance to you before our next scheduled appointment.  Our next appointment is by telephone on 09/19/24 at 3 30 pm Please call the care guide team at [phone] if you need to cancel or reschedule your appointment.   Following is a copy of your care plan:   Goals Addressed             This Visit's Progress    LCSW VBCI Social Work Care Plan        Current barriers:   Mental Health needs related to symptoms of depression, stress and anxiety. Patient requires Support, Education, Resources, Referrals, Advocacy, and Care Coordination, in order to meet Unmet Mental Health Needs and to find a therapist and psychiatrist. Mental Health Concerns and Social Isolation Patient lacks knowledge of local and available community resources and counseling agencies.   Clinical Goal(s): verbalize understanding of plan for management of Anxiety, Depression, and Stress symptoms and demonstrate a reduction in symptoms. Patient will connect with a provider for ongoing mental health treatment, increase coping skills, healthy habits, self-management skills, and stress reduction      Patient will implement clinical interventions discussed today to decrease symptoms of depression and increase knowledge and/or ability of: coping skills.    Clinical Interventions:  Assessed patient's previous and current treatment, coping skills, support system and barriers to care. Patient provided hx  Verbalization of feelings encouraged, motivational interviewing employed Emotional support provided, positive coping strategies explored. Establishing healthy boundaries emphasized and healthy self-care education provided Patient was educated on available mental health resources within their area that accept Medicaid and offer counseling and psychiatry. Patient was advised to contact the back of her insurance  card for assistance with benefits as well. Patient is agreeable to referral to [facility] for counseling and psychiatry. LCSW placed referral on 09/05/24. Email sent to patient today with available mental health resources within her area that accept Medicaid and offer the services that she is interested in. Email included instructions for scheduling at [facility] as well as some crisis support resources and [facility]'s walk in clinic hours. Patient will review resources over the next two weeks and make a decision regarding where he wishes to gain MH treatment at. Emotional support provided. CBT intervention implemented regarding being mentally fit by combating negative thinking and replacing it with uplifting support, hope and positivity. Patient reports significant worsening anxiety impacting their ability to function appropriately and carry out daily task. Assessed social determinant of health barriers LCSW provided education on relaxation techniques such as meditation, deep breathing, massage, grounding exercises or yoga that can activate the body's relaxation response and ease symptoms of stress and anxiety. LCSW ask that when pt is struggling with difficult emotions and racing thoughts that they start this relaxation response process. LCSW provided extensive education on healthy coping skills for anxiety. SW used active and reflective listening, validated patient's feelings/concerns, and provided emotional support. Patient will work on implementing appropriate self-care habits into their daily routine such as: staying positive, writing a gratitude list, drinking water, staying active around the house, taking their medications as prescribed, combating negative thoughts or emotions and staying connected with their family and friends. Positive reinforcement provided for this decision to work on this.  Motivational Interviewing employed Depression screen reviewed  PHQ2/ PHQ9 completed or reviewed  Mindfulness or  Relaxation training provided Active listening / Reflection utilized  Advance  Care and HCPOA education provided Emotional Support Provided Problem Solving /Task Center strategies reviewed Provided psychoeducation for mental health needs  Provided brief CBT  Reviewed mental health medications and discussed importance of compliance:  Quality of sleep assessed & Sleep Hygiene techniques promoted  Participation in counseling encouraged  Verbalization of feelings encouraged  Suicidal Ideation/Homicidal Ideation assessed: Patient denies SI/HI  Review resources, discussed options and provided patient information about  Mental Health Resources Inter-disciplinary care team collaboration (see longitudinal plan of care) Referral made for VBCI RNCM   Patient Goals/Self-Care Activities: Take medications as prescribed   Attend all scheduled provider appointments Call pharmacy for medication refills 3-7 days in advance of running out of medications Perform all self care activities independently  Perform IADL's (shopping, preparing meals, housekeeping, managing finances) independently Call provider office for new concerns or questions Work with the social worker to address care coordination needs and will continue to work with the clinical team to address health care and disease management related needs call 1-800-273-TALK (toll free, 24 hour hotline) If in a crisis, CALL 988 or go to [facility] [address] [place] [phone]) Utilize healthy coping skills and supportive resources discussed Contact PCP with any questions or concerns Keep 90 percent of counseling appointments Call your insurance provider for more information about your Enhanced Benefits         Please call the Suicide and Crisis Lifeline: 988 call the [place]  National Suicide Prevention Lifeline: 2128885027 or TTY: 8137595840 TTY 580-773-2127) to talk to a trained counselor call  1-800-273-TALK (toll free, 24 hour hotline) go to [facility] [address] [place] [phone]) call 911 if you are experiencing a Mental Health or Behavioral Health Crisis or need someone to talk to.  Patient verbalizes understanding of instructions and care plan provided today and agrees to view in MyChart. Active MyChart status and patient understanding of how to access instructions and care plan via MyChart confirmed with patient.     Lyle Rung, BSW, MSW, LCSW Licensed Clinical Social Worker [employer]   [facility] [email or web address] Direct Dial: [phone]

## 2024-09-07 ENCOUNTER — Other Ambulatory Visit (HOSPITAL_COMMUNITY): Payer: Self-pay

## 2024-09-07 MED ORDER — TRIAMCINOLONE ACETONIDE 0.1 % EX OINT
1.0000 | TOPICAL_OINTMENT | Freq: Two times a day (BID) | CUTANEOUS | 0 refills | Status: AC
  Filled 2024-09-07: qty 80, 30d supply, fill #0

## 2024-09-07 NOTE — Telephone Encounter (Signed)
 Discussed echo results with patient.  Discussed following up with cardiology for potential cardiac MRI and further evaluation of hokum.  Sent in another referral for his sleep apnea since [place] sleep center told him that they needed this.  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  He does experience a lot of discomfort and difficulty with mobilizing with his condition.  He mentions he has applied for disability for this.  We discussed the impacts of elevated BMI, venous reflux as seen in 2023, and uncontrolled diabetes.  I recommend that he also follow-up with vascular surgery since he has not seen them since 2023.  I sent in a refill of his Kenalog  ointment to apply to the area to help with skin lightening to [facility] given cost was too high at [employer].  Patient will keep me updated on the above.

## 2024-09-08 ENCOUNTER — Other Ambulatory Visit: Payer: Self-pay

## 2024-09-08 ENCOUNTER — Other Ambulatory Visit (HOSPITAL_COMMUNITY): Payer: Self-pay

## 2024-09-08 NOTE — Patient Outreach (Signed)
 Complex Care Management   Visit Note  09/08/2024  Name:  Fred Reyes MRN: [id number] DOB: Aug 11, 1991  Situation: Referral received for Complex Care Management related to Diabetes with Complications and HTN I obtained verbal consent from Patient.  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   Thickened nails 08/23/2023   Type 2 diabetes mellitus without complication, without long-term current use of insulin  (HCC) 04/16/2019    Assessment: Patient Reported Symptoms:  Cognitive Cognitive Status: Able to follow simple commands, Alert and oriented to person, place, and time, Normal speech and language skills Cognitive/Intellectual Conditions Management [RPT]: None reported or documented in medical history or problem list      Neurological Neurological Review of Symptoms: Other: [place] Neurological Symptoms/Conditions [RPT]: Occasional tingling in feet Neurological Management Strategies: Coping strategies, Routine screening  HEENT HEENT Symptoms Reported: No symptoms reported HEENT Management Strategies: Routine screening HEENT Comment: PCP placed referral for opthalmologist at most recent visit, patient reports visit is scheduled 10/30/24 with [facility]    Cardiovascular Cardiovascular Symptoms Reported: Other:, Swelling in legs or feet Other Cardiovascular Symptoms: Patient reports pain/cramping in his legs especially when climbing stairs and walking. He reports he does wear knee high compression socks, but feels they do not help with swelling. Swelling R>L. He does feel that compression socks help  with pain/cramping. Discussed elevating legs when resting Does patient have uncontrolled Hypertension?: Yes Is patient checking Blood Pressure at home?: Yes (every so often when I feel like it's rising) Patient's Recent BP reading at home: Patient does not remember. Advised of goal BP <130/80 Cardiovascular Management Strategies: Medication therapy, Routine screening, Coping strategies Cardiovascular Comment: Patient has cardiology appointment scheduled in December. He notes that PCP wants him to see vascular provider, but referral has not been placed yet.  Respiratory Respiratory Symptoms Reported: No symptoms reported Additional Respiratory Details: Patient reports he still has to schedule sleep study. Provided phone number for [facility] where referral was sent. He reports he has previously been evaluated for sleep apnea but does not wear a CPAP. Respiratory Management Strategies: Routine screening  Endocrine Endocrine Symptoms Reported: No symptoms reported Is patient diabetic?: Yes Is patient checking blood sugars at home?: No Endocrine Comment: Patient reports he has to go pick up CGM from the pharmacy. He does not have a glucometer. Patient reports that he has watched a video on how to apply sensor and has a family member who also uses FreeStyle Libre  Gastrointestinal Gastrointestinal Symptoms Reported: No symptoms reported Additional Gastrointestinal Details: Patient reports a good appetite. He reports regular BMs      Genitourinary Genitourinary Symptoms Reported: No symptoms reported    Integumentary Integumentary Symptoms Reported: No symptoms reported    Musculoskeletal Musculoskelatal Symptoms Reviewed: No symptoms reported   Falls in the past year?: No Number of falls in past year: 1 or less Was there an injury with Fall?: No Fall Risk Category Calculator: 0 Patient Fall Risk Level: Low Fall Risk Patient at Risk for Falls Due to: No Fall Risks Fall risk Follow  up: Falls evaluation completed, Education  provided, Falls prevention discussed  Psychosocial Psychosocial Symptoms Reported: Not assessed          09/08/2024    PHQ2-9 Depression Screening   Little interest or pleasure in doing things    Feeling down, depressed, or hopeless    PHQ-2 - Total Score    Trouble falling or staying asleep, or sleeping too much    Feeling tired or having little energy    Poor appetite or overeating     Feeling bad about yourself - or that you are a failure or have let yourself or your family down    Trouble concentrating on things, such as reading the newspaper or watching television    Moving or speaking so slowly that other people could have noticed.  Or the opposite - being so fidgety or restless that you have been moving around a lot more than usual    Thoughts that you would be better off dead, or hurting yourself in some way    PHQ2-9 Total Score    If you checked off any problems, how difficult have these problems made it for you to do your work, take care of things at home, or get along with other people    Depression Interventions/Treatment      There were no vitals filed for this visit.  Medications Reviewed Today     Reviewed by Arno Rosaline SQUIBB, RN (Registered Nurse) on 09/08/24 at 0902  Med List Status: <None>   Medication Order Taking? Sig Documenting Provider Last Dose Status Informant  atorvastatin  (LIPITOR) 40 MG tablet [id number] Yes Take 1 tablet (40 mg total) by mouth daily. Tharon Lung, MD  Active   Blood Glucose Monitoring Suppl w/Device KIT [id number] Yes Check blood sugar once daily BEFORE breakfast. Tharon Lung, MD  Active   Continuous Glucose Sensor (FREESTYLE LIBRE 3 PLUS SENSOR) MISC [id number]  Change sensor every 15 days. McDiarmid, Krystal BIRCH, MD  Active   glucose blood test strip [id number] Yes Use to check blood sugar once daily BEFORE breakfast. Tharon Lung, MD  Active   Insulin  Pen Needle (PEN NEEDLES) 32G X 5 MM MISC  [id number] Yes 1 Container by Does not apply route as needed. McDiarmid, Krystal BIRCH, MD  Active   Lancets MISC [id number] Yes Use to check blood sugar once daily BEFORE breakfast. Tharon Lung, MD  Active   metFORMIN  (GLUCOPHAGE -XR) 750 MG 24 hr tablet [id number] Yes Take 2 tablets (1,500 mg total) by mouth daily with breakfast. Tharon Lung, MD  Active   olmesartan -hydrochlorothiazide (BENICAR  HCT) 20-12.5 MG tablet [id number] Yes Take 1 tablet by mouth daily. Tharon Lung, MD  Active   Semaglutide ,0.25 or 0.5MG /DOS, (OZEMPIC , 0.25 OR 0.5 MG/DOSE,) 2 MG/1.5ML SOPN [id number] Yes Inject 0.25 mg into the skin once a week. Tharon Lung, MD  Active   SEMGLEE , YFGN, 100 UNIT/ML Pen [id number] Yes Inject 14-50 Units into the skin daily. Increase dose as instructed. Tharon Lung, MD  Active   triamcinolone  ointment (KENALOG ) 0.1 % [id number]  Apply 1 Application topically 2 (two) times daily. Apply until skin lightening is achieved. Tharon Lung, MD  Active             Recommendation:   Continue Current Plan of Care Patient will call [facility] to schedule sleep study  Follow Up Plan:   Telephone follow up appointment date/time:  09/24/24 at 10 AM  Rosaline Arno, RN MSN [place]  [facility] RN Care Manager Direct Dial: [phone]  Fax: [phone]

## 2024-09-08 NOTE — Patient Instructions (Signed)
 Visit Information  Fred Reyes was given information about Medicaid Managed Care team care coordination services as a part of their [employer] Medicaid benefit.   If you would like to schedule transportation through your [facility] plan, please call the following number at least 2 days in advance of your appointment: [phone]  If you are experiencing a behavioral health crisis, call the [employer] [place]  Behavioral Health Crisis Line at [phone]). The line is available 24 hours a day, seven days a week.   Please see education materials related to Diabetes: Health Eating for Adults provided by MyChart link.  Patient verbalizes understanding of instructions and care plan provided today and agrees to view in MyChart. Active MyChart status and patient understanding of how to access instructions and care plan via MyChart confirmed with patient.     The Patient                                              will call [facility] as advised to schedule sleep study.  Telephone follow up appointment with Managed Medicaid care management team member scheduled for: 09/24/24 at 10 AM  Rosaline Finlay, RN MSN [place]  [facility] RN Care Manager Direct Dial: [phone]  Fax: [phone]   Following is a copy of your plan of care:  There are no care plans that you recently modified to display for this patient.   Diabetes: Healthy Eating for Adults When you have diabetes, also called diabetes mellitus, it's important to have healthy eating habits. Your blood sugar (glucose) levels are greatly affected by what you eat and drink. You need to eat healthy foods in the right amounts, at about the same times each day. Doing this can help you: Manage your blood sugar. Lower your risk of heart disease. Improve your blood pressure. Reach or stay at a healthy weight. What can affect my meal plan? Every  person with diabetes is different. And each person has different needs for a meal plan. Your health care provider may suggest that you work with an expert in healthy eating called a dietitian. They can help you make a meal plan that's best for you. How do carbohydrates affect me? Carbohydrates, also called carbs, affect your blood sugar level more than any other type of food. Eating carbs raises the amount of sugar in your blood. It's important to know how many carbs you can safely have in each meal. This is different for every person. Your dietitian can help you calculate how many carbs you should have at each meal and for each snack. How does alcohol affect me? Alcohol can cause a decrease in blood sugar (hypoglycemia), especially if you use insulin  or take certain diabetes medicines by mouth. Hypoglycemia can be a life-threatening condition. Symptoms of hypoglycemia are similar to those of having too much alcohol. They include confusion, being sleepy, and feeling dizzy. Do not drink alcohol if: Your provider tells you not to drink. You're pregnant, may be pregnant, or plan to become pregnant. What are tips for following this plan? Reading food labels Start by checking the serving size on the Nutrition Facts label of packaged foods and drinks. The number of calories and the amount of carbs, fats, and other nutrients listed on the label are based on one serving of the item. Many items contain more  than one serving per package. Check the total grams (g) of carbs in one serving. Check the number of grams of saturated fats and trans fats in one serving. Choose foods that have a low amount or none of these fats. Check the number of milligrams (mg) of salt (sodium) in one serving. Most people should limit their total sodium intake to less than 2,300 mg per day. Always check the nutrition information of foods labeled as low-fat or nonfat. These foods may be higher in added sugar or refined carbs and  should be avoided. Talk to your dietitian to identify your daily goals for nutrients listed on the label. Shopping Avoid buying canned, pre-made, or processed foods. These foods tend to be high in fat, sodium, and added sugar. Shop around the outside edge of the grocery store. This is where you'll most often find fresh fruits and vegetables, bulk grains, fresh meats, and fresh dairy products. Cooking Use low-heat cooking methods, such as baking, instead of high-heat methods like deep frying. Cook using healthy oils, such as olive, canola, or sunflower oil. Avoid cooking with butter, cream, or high-fat meats. Meal planning  Eat meals and snacks regularly. Try to eat them at the same times every day. Avoid going too long without eating. Eat foods that are high in fiber, such as fresh fruits, vegetables, beans, and whole grains. Eat 4-6 oz (112-168 g) of lean protein each day, such as lean meat, chicken, fish, eggs, or tofu. One ounce (oz) (28 g) of lean protein is equal to: 1 oz (28 g) of meat, chicken, or fish. 1 egg.  cup (62 g) of tofu. Eat some foods each day that contain healthy fats, such as avocado, nuts, seeds, and fish. What foods should I eat? Fruits Berries. Apples. Oranges. Peaches. Apricots. Plums. Grapes. Mangoes. Papayas. Pomegranates. Kiwi. Cherries. Vegetables Leafy greens, including lettuce, spinach, kale, chard, collard greens, mustard greens, and cabbage. Beets. Cauliflower. Broccoli. Carrots. Green beans. Tomatoes. Peppers. Onions. Cucumbers. Brussels sprouts. Grains Whole grains, such as whole-wheat or whole-grain bread, crackers, tortillas, cereal, and pasta. Unsweetened oatmeal. Quinoa. Brown or wild rice. Meats and other proteins Seafood. Poultry without skin. Lean cuts of poultry and beef. Tofu. Nuts. Seeds. Dairy Low-fat or fat-free dairy products such as milk, yogurt, and cheese. The items listed above may not be all the foods and drinks you can have. Talk with  a dietitian to learn more. What foods should I avoid? Fruits Fruits canned with syrup. Vegetables Canned vegetables. Frozen vegetables with butter or cream sauce. Grains Refined white flour and flour products such as bread, pasta, snack foods, and cereals. Avoid all processed foods. Meats and other proteins Fatty cuts of meat. Poultry with skin. Breaded or fried meats. Processed meat. Avoid saturated fats. Dairy Full-fat yogurt, cheese, or milk. Beverages Sweetened drinks, such as soda or iced tea. The items listed above may not be all the foods and drinks you should avoid. Talk with a dietitian to learn more. Where to find more information: To learn more, go to: Academy of Nutrition and Dietetics at DeathPrevention.it. Click Search and type diabetes. Find the link you need. Centers for Disease Control and Prevention at TonerPromos.no. Click Search and type diabetes. Find the link you need. American Diabetes Association: diabetes.org/food-nutrition [employer] of Diabetes and Digestive and Kidney Diseases: StageSync.si This information is not intended to replace advice given to you by your health care provider. Make sure you discuss any questions you have with your health care provider. Document Revised: 11/22/2023  Document Reviewed: 11/22/2023 Elsevier Patient Education  [employer].

## 2024-09-11 ENCOUNTER — Other Ambulatory Visit: Payer: Self-pay

## 2024-09-11 NOTE — Patient Instructions (Signed)
 [facility] - I am sorry I was unable to reach you today for our scheduled appointment. I work with Tharon Lung, MD and am calling to support your healthcare needs. Please contact me at [phone] at your earliest convenience. I look forward to speaking with you soon.   Thank you,  Orlean Fey, BSW [place]  [facility] Social Worker, [employer] [phone]

## 2024-09-12 ENCOUNTER — Other Ambulatory Visit: Payer: Self-pay

## 2024-09-12 ENCOUNTER — Telehealth

## 2024-09-12 NOTE — Patient Outreach (Signed)
 Complex Care Management   Visit Note  09/12/2024  Name:  Fred Reyes MRN: [id number] DOB: 1991/10/19  Situation: Referral received for Complex Care Management related to Disability I obtained verbal consent from Patient.  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 During the call, BSW spoke with [facility] to gather more information regarding patient's eligibility. According to the agency, based on patient's medical history, he would not meet the qualifications for disability at this time. BSW encouraged patient to still consider applying directly through the Social Security Administration to explore alternative routes or programs that may provide support or determine eligibility through a different process. BSW will send patient additional resources and information to assist with the application process. BSW will close the case at this time and informed patient to reach out if further assistance is needed.  Resources Provided:  -[employer]  -Social Security Administration - [employer]  -Landscape [employer] of [place]  - Patent examiner  -[facility]  Recommendation:   No recommendations at this time  Follow Up Plan:   Patient has met all care management goals. Care Management case will be closed. Patient has been provided contact information should new  needs arise.   Orlean Fey, BSW [place]  [facility] Social Worker, [employer] [facility] [phone]

## 2024-09-12 NOTE — Patient Instructions (Signed)

## 2024-09-17 ENCOUNTER — Other Ambulatory Visit (HOSPITAL_COMMUNITY): Payer: Self-pay

## 2024-09-18 ENCOUNTER — Other Ambulatory Visit: Payer: Self-pay | Admitting: Family Medicine

## 2024-09-18 NOTE — Progress Notes (Signed)
 Patient has been having issues with restful sleep. Has been diagnosed with severe sleep apnea in the past. Snores badly. Patient has a high risk STOP-BANG score. Also with morning headaches. Referred to sleep studies for an updated sleep study to obtain CPAP and have titration as needed.

## 2024-09-19 ENCOUNTER — Other Ambulatory Visit: Payer: Self-pay | Admitting: Licensed Clinical Social Worker

## 2024-09-19 NOTE — Patient Instructions (Signed)
 [facility] - I am sorry I was unable to reach you today for our scheduled appointment. I work with Tharon Lung, MD and am calling to support your healthcare needs. Please contact me at Direct Dial: [phone] at your earliest convenience. I look forward to speaking with you soon.   Thank you, Lyle Rung, BSW, MSW, LCSW Licensed Clinical Social Worker [employer]   [email or web address] Direct Dial: [phone]

## 2024-09-24 ENCOUNTER — Other Ambulatory Visit: Payer: Self-pay

## 2024-09-24 NOTE — Patient Outreach (Signed)
 Complex Care Management   Visit Note  09/24/2024  Name:  Fred Reyes MRN: [id number] DOB: 09-06-1991  Situation: Referral received for Complex Care Management related to Diabetes with Complications and HTN I obtained verbal consent from Patient.  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   Thickened nails 08/23/2023   Type 2 diabetes mellitus without complication, without long-term current use of insulin  (HCC) 04/16/2019    Assessment: Patient Reported Symptoms:  Cognitive Cognitive Status: Able to follow simple commands, Alert and oriented to person, place, and time, Normal speech and language skills Cognitive/Intellectual Conditions Management [RPT]: None reported or documented in medical history or problem list      Neurological Neurological Review of Symptoms: Headaches, Other: [place] Neurological Symptoms/Conditions [RPT]: Occasional tingling in feet Neurological Management Strategies: Coping strategies, Medication therapy, Routine screening Neurological Comment: Patient reports headaches are occasionally relieved with Tylenol   HEENT HEENT Symptoms Reported: Not assessed      Cardiovascular Cardiovascular Symptoms Reported: Swelling in legs or feet, Other: Other Cardiovascular Symptoms: Patient reports continued swelling and pain/cramping in his legs. He continues to wear compression socks Does patient have uncontrolled Hypertension?: Yes Is patient checking Blood Pressure at home?: No Patient's Recent BP reading at home: Patient has not been checking his BP monitior because he is unsure if his monitor is  accurate. Advised that he bring BP monitor to next PCP visit or cardiology visit to assess for accuracy Cardiovascular Management Strategies: Medication therapy, Routine screening, Coping strategies  Respiratory Respiratory Symptoms Reported: Dry cough Other Respiratory Symptoms: Patient reports a dry cough over the past day or two, but reports his daughter is getting over a cold. Patient denies fever or chills. Patient reports he took OTC cough syrup which helped a little bit. He has also been trying to drink hot tea with honey and peppermint Additional Respiratory Details: Note per chart review patient is scheduled for sleep study 10/13/24 Respiratory Management Strategies: Routine screening  Endocrine Endocrine Symptoms Reported: Headaches, Increased urination Is patient diabetic?: Yes Is patient checking blood sugars at home?: No Endocrine Comment: Patient reports he has not been to the pharmacy to pick up CGM due to concerns for cost. He does not have a glucometer. Patient notes that PCP office had previously been supplying sensors. Message sent to Dr. Koval to see if office has any samples they can provide patient.  Gastrointestinal Gastrointestinal Symptoms Reported: No symptoms reported      Genitourinary Genitourinary Symptoms Reported: No symptoms reported    Integumentary Integumentary Symptoms Reported: Not assessed    Musculoskeletal Musculoskelatal Symptoms Reviewed: No symptoms reported Musculoskeletal Comment: Patient denies falls since previous CMRN visit Falls in the past year?: No Number of falls in past year: 1 or less Was there an injury with Fall?: No Fall Risk Category Calculator: 0 Patient Fall Risk Level: Low Fall Risk Patient at Risk for Falls Due to: No Fall Risks Fall risk Follow up: Falls evaluation completed, Education provided  Psychosocial Psychosocial Symptoms Reported: Sadness - if selected complete PHQ 2-9 Behavioral Management Strategies: Adequate rest,  Coping strategies Major Change/Loss/Stressor/Fears (CP): Resources  Techniques to Cope with Loss/Stress/Change: Diversional activities      09/24/2024    PHQ2-9 Depression Screening   Little interest or pleasure in doing things Not at all  Feeling down, depressed, or hopeless Several days  PHQ-2 - Total Score 1  Trouble falling or staying asleep, or sleeping too much    Feeling tired or having little energy    Poor appetite or overeating     Feeling bad about yourself - or that you are a failure or have let yourself or your family down    Trouble concentrating on things, such as reading the newspaper or watching television    Moving or speaking so slowly that other people could have noticed.  Or the opposite - being so fidgety or restless that you have been moving around a lot more than usual    Thoughts that you would be better off dead, or hurting yourself in some way    PHQ2-9 Total Score    If you checked off any problems, how difficult have these problems made it for you to do your work, take care of things at home, or get along with other people    Depression Interventions/Treatment      There were no vitals filed for this visit.  Medications Reviewed Today     Reviewed by Arno Rosaline SQUIBB, RN (Registered Nurse) on 09/24/24 at 1012  Med List Status: <None>   Medication Order Taking? Sig Documenting Provider Last Dose Status Informant  atorvastatin  (LIPITOR) 40 MG tablet [id number]  Take 1 tablet (40 mg total) by mouth daily. Tharon Lung, MD  Active   Blood Glucose Monitoring Suppl w/Device KIT [id number]  Check blood sugar once daily BEFORE breakfast. Tharon Lung, MD  Active   Continuous Glucose Sensor (FREESTYLE LIBRE 3 PLUS SENSOR) MISC [id number]  Change sensor every 15 days. McDiarmid, Krystal BIRCH, MD  Active   glucose blood test strip [id number]  Use to check blood sugar once daily BEFORE breakfast. Tharon Lung, MD  Active   Insulin  Pen Needle (PEN NEEDLES) 32G X 5 MM MISC  [id number]  1 Container by Does not apply route as needed. McDiarmid, Krystal BIRCH, MD  Active   Lancets MISC [id number]  Use to check blood sugar once daily BEFORE breakfast. Tharon Lung, MD  Active   metFORMIN  (GLUCOPHAGE -XR) 750 MG 24 hr tablet [id number]  Take 2 tablets (1,500 mg total) by mouth daily with breakfast. Tharon Lung, MD  Active   olmesartan -hydrochlorothiazide (BENICAR  HCT) 20-12.5 MG tablet [id number]  Take 1 tablet by mouth daily. Tharon Lung, MD  Active   Semaglutide ,0.25 or 0.5MG /DOS, (OZEMPIC , 0.25 OR 0.5 MG/DOSE,) 2 MG/1.5ML SOPN [id number]  Inject 0.25 mg into the skin once a week. Tharon Lung, MD  Active   SEMGLEE , YFGN, 100 UNIT/ML Pen [id number]  Inject 14-50 Units into the skin daily. Increase dose as instructed. Tharon Lung, MD  Active   triamcinolone  ointment (KENALOG ) 0.1 % [id number]  Apply 1 Application topically 2 (two) times daily. Apply until skin lightening is achieved. Tharon Lung, MD  Active             Recommendation:   Continue Current Plan of Care  Follow Up Plan:   Telephone follow up appointment date/time:  11/5/825 at 10 AM  Rosaline Arno, RN MSN [place]  [facility] Health RN Care Manager Direct Dial: [phone]  Fax: [phone]

## 2024-09-24 NOTE — Patient Instructions (Signed)
 Visit Information  Mr. Burget was given information about Medicaid Managed Care team care coordination services as a part of their [employer] Medicaid benefit.   If you would like to schedule transportation through your [employer] [facility] plan, please call the following number at least 2 days in advance of your appointment: [phone]  If you are experiencing a behavioral health crisis, call the [employer] [place]  Behavioral Health Crisis Line at [phone]). The line is available 24 hours a day, seven days a week.    Patient verbalizes understanding of instructions and care plan provided today and agrees to view in MyChart. Active MyChart status and patient understanding of how to access instructions and care plan via MyChart confirmed with patient.     Telephone follow up appointment with Managed Medicaid care management team member scheduled for: 10/22/24 at 10 AM  I have sent a message to Dr. Koval regarding FreeStyle Libre sensor cost concerns. He will be reaching out to you to discuss further, so please be on the lookout for his call!  Rosaline Finlay, RN MSN [place]  [facility] RN Care Manager Direct Dial: [phone]  Fax: [phone]   Following is a copy of your plan of care:  There are no care plans that you recently modified to display for this patient.

## 2024-09-25 ENCOUNTER — Telehealth: Payer: Self-pay | Admitting: Pharmacist

## 2024-09-25 NOTE — Telephone Encounter (Signed)
 Patient contacted for follow-up of need for diabetes - CGM supply per RN Care coordinator.  No answer  Left HIPAA compliant message requesting call back for Sensor supply and for appointment rescheduling.  Provided direct phone line to me for call back.   Total time with patient call and documentation of interaction: 9 minutes.

## 2024-09-26 NOTE — Telephone Encounter (Signed)
 Reviewed and agree with Dr Rennis plan.

## 2024-09-29 ENCOUNTER — Encounter: Payer: Self-pay | Admitting: Pharmacist

## 2024-10-01 ENCOUNTER — Encounter (HOSPITAL_COMMUNITY): Payer: Self-pay

## 2024-10-01 ENCOUNTER — Ambulatory Visit (HOSPITAL_COMMUNITY): Admitting: Psychiatry

## 2024-10-01 NOTE — Progress Notes (Deleted)
 Psychiatric Initial Adult Assessment  Virtual Visit via Video Note  I connected with [facility] on 10/01/24 at  8:00 AM EDT by a video enabled telemedicine application and verified that I am speaking with the correct person using two identifiers.  Location: Patient: Home Provider: Clinic   I discussed the limitations of evaluation and management by telemedicine and the availability of in person appointments. The patient expressed understanding and agreed to proceed.  I provided 45 minutes of non-face-to-face time during this encounter.    Patient Identification: Fred Reyes MRN:  [id number] Date of Evaluation:  10/01/2024 Referral Source: Merlynn Bottcher, Social worker Chief Complaint:  No chief complaint on file.  Visit Diagnosis: No diagnosis found.  History of Present Illness: 33 year old male seen today for additional psychiatric provider.  He has a psychiatric history of adjustment disorder.  Currently he is not managed on medications.  Today he was well-groomed, pleasant, cooperative, engaged in conversation.  He informed Clinical research associate that he has been having  Associated Signs/Symptoms: Depression Symptoms:  {DEPRESSION SYMPTOMS:20000} (Hypo) Manic Symptoms:  {BHH MANIC SYMPTOMS:22872} Anxiety Symptoms:  {BHH ANXIETY SYMPTOMS:22873} Psychotic Symptoms:  {BHH PSYCHOTIC SYMPTOMS:22874} PTSD Symptoms: {BHH PTSD SYMPTOMS:22875}  Past Psychiatric History: Adjustment disorder  Previous Psychotropic Medications: No   Substance Abuse History in the last 12 months:  {yes no:314532}  Consequences of Substance Abuse: {BHH CONSEQUENCES OF SUBSTANCE ABUSE:22880}  Past Medical History:  Past Medical History:  Diagnosis Date   Anxiety    Asthma due to environmental allergies    Constipation 12/18/2008   ED visit   Depression, major, in remission    History of psychiatric hospitalization    3 months for suicide attempt   Hypertension 04/16/2019   Morbid obesity (HCC)  04/16/2019   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   Thickened nails 08/23/2023   Type 2 diabetes mellitus without complication, without long-term current use of insulin  (HCC) 04/16/2019    Past Surgical History:  Procedure Laterality Date   NO PAST SURGERIES      Family Psychiatric History: ***  Family History:  Family History  Adopted: Yes  Family history unknown: Yes    Social History:   Social History   Socioeconomic History   Marital status: Married    Spouse name: Lela Lento   Number of children: 1   Years of education: Not on file   Highest education level: 8th grade  Occupational History   Not on file  Tobacco Use   Smoking status: Former    Current packs/day: 0.50    Average packs/day: 0.5 packs/day for 2.0 years (1.0 ttl pk-yrs)    Types: Cigarettes   Smokeless tobacco: Never  Vaping Use   Vaping status: Former  Substance and Sexual Activity   Alcohol use: Yes    Alcohol/week: 7.0 standard drinks of alcohol    Types: 7 Cans of beer per week   Drug use: Not Currently    Types: Marijuana, Cocaine    Comment: no MJ or cocaine use in about 2 years. previously used cocaine about once/month   Sexual activity: Yes  Other Topics Concern   Not on file  Social History Narrative   Not on file   Social Drivers of Health   Financial Resource Strain: High Risk (08/29/2024)   Overall Financial Resource Strain (CARDIA)    Difficulty of Paying Living Expenses: Very hard  Food Insecurity: Food Insecurity Present (08/29/2024)   Hunger Vital Sign    Worried About Programme researcher, broadcasting/film/video in  the Last Year: Often true    Ran Out of Food in the Last Year: Often true  Transportation Needs: Unmet Transportation Needs (08/29/2024)   PRAPARE - Administrator, Civil Service (Medical): Yes    Lack of Transportation (Non-Medical): Yes  Physical Activity: Inactive (07/19/2024)   Exercise Vital Sign    Days of Exercise per Week: 7 days    Minutes of  Exercise per Session: 0 min  Stress: No Stress Concern Present (09/05/2024)   [employer] of Occupational Health - Occupational Stress Questionnaire    Feeling of Stress: Only a little  Social Connections: Socially Integrated (07/19/2024)   Social Connection and Isolation Panel    Frequency of Communication with Friends and Family: More than three times a week    Frequency of Social Gatherings with Friends and Family: More than three times a week    Attends Religious Services: 1 to 4 times per year    Active Member of Clubs or Organizations: Yes    Attends Banker Meetings: Never    Marital Status: Married    Additional Social History: ***  Allergies:  No Known Allergies  Metabolic Disorder Labs: Lab Results  Component Value Date   HGBA1C 11.2 (A) 08/12/2024   No results found for: PROLACTIN Lab Results  Component Value Date   CHOL 239 (H) 08/23/2023   TRIG 835 (HH) 08/23/2023   HDL 25 (L) 08/23/2023   CHOLHDL 9.6 (H) 08/23/2023   LDLCALC Comment (A) 08/23/2023   LDLCALC 133 (H) 01/28/2021   Lab Results  Component Value Date   TSH 1.190 01/28/2021    Therapeutic Level Labs: No results found for: LITHIUM No results found for: CBMZ No results found for: VALPROATE  Current Medications: Current Outpatient Medications  Medication Sig Dispense Refill   atorvastatin  (LIPITOR) 40 MG tablet Take 1 tablet (40 mg total) by mouth daily. 90 tablet 3   Blood Glucose Monitoring Suppl w/Device KIT Check blood sugar once daily BEFORE breakfast. 1 kit 0   Continuous Glucose Sensor (FREESTYLE LIBRE 3 PLUS SENSOR) MISC Change sensor every 15 days. 2 each 11   glucose blood test strip Use to check blood sugar once daily BEFORE breakfast. 100 each 12   Insulin  Pen Needle (PEN NEEDLES) 32G X 5 MM MISC 1 Container by Does not apply route as needed. 100 each PRN   Lancets MISC Use to check blood sugar once daily BEFORE breakfast. 100 each 0   metFORMIN   (GLUCOPHAGE -XR) 750 MG 24 hr tablet Take 2 tablets (1,500 mg total) by mouth daily with breakfast. 180 tablet 5   olmesartan -hydrochlorothiazide (BENICAR  HCT) 20-12.5 MG tablet Take 1 tablet by mouth daily. 30 tablet 0   Semaglutide ,0.25 or 0.5MG /DOS, (OZEMPIC , 0.25 OR 0.5 MG/DOSE,) 2 MG/1.5ML SOPN Inject 0.25 mg into the skin once a week. 1.5 mL 0   SEMGLEE , YFGN, 100 UNIT/ML Pen Inject 14-50 Units into the skin daily. Increase dose as instructed. 15 mL 5   triamcinolone  ointment (KENALOG ) 0.1 % Apply 1 Application topically 2 (two) times daily. Apply until skin lightening is achieved. 80 g 0   No current facility-administered medications for this visit.    Musculoskeletal: Strength & Muscle Tone: {desc; muscle tone:32375} Gait & Station: {PE GAIT ED WJUO:77474} Patient leans: {Patient Leans:21022755}  Psychiatric Specialty Exam: Review of Systems  There were no vitals taken for this visit.There is no height or weight on file to calculate BMI.  General Appearance: {Appearance:22683}  Eye Contact:  {  BHH EYE CONTACT:22684}  Speech:  {Speech:22685}  Volume:  {Volume (PAA):22686}  Mood:  {BHH MOOD:22306}  Affect:  {Affect (PAA):22687}  Thought Process:  {Thought Process (PAA):22688}  Orientation:  {BHH ORIENTATION (PAA):22689}  Thought Content:  {Thought Content:22690}  Suicidal Thoughts:  {ST/HT (PAA):22692}  Homicidal Thoughts:  {ST/HT (PAA):22692}  Memory:  {BHH MEMORY:22881}  Judgement:  {Judgement (PAA):22694}  Insight:  {Insight (PAA):22695}  Psychomotor Activity:  {Psychomotor (PAA):22696}  Concentration:  {Concentration:21399}  Recall:  {BHH GOOD/FAIR/POOR:22877}  Fund of Knowledge:{BHH GOOD/FAIR/POOR:22877}  Language: {BHH GOOD/FAIR/POOR:22877}  Akathisia:  {BHH YES OR NO:22294}  Handed:  {Handed:22697}  AIMS (if indicated):  {Desc; done/not:10129}  Assets:  {Assets (PAA):22698}  ADL's:  {BHH JIO'D:77709}  Cognition: {chl bhh cognition:304700322}  Sleep:  {BHH  GOOD/FAIR/POOR:22877}   Screenings: Oceanographer Row Patient Outreach Telephone from 09/24/2024 in [place] [facility] DEPARTMENT Patient Outreach Telephone from 09/05/2024 in [place] POPULATION HEALTH DEPARTMENT Office Visit from 08/12/2024 in [facility] - A Dept Of [place]. [facility] Office Visit from 08/23/2023 in [facility] - A Dept Of [place]. [facility] Office Visit from 03/03/2022 in [facility] - A Dept Of [place]. [facility]  PHQ-2 Total Score 1 1 0 3 4  PHQ-9 Total Score -- -- 8 13 16    Flowsheet Row ED from 10/23/2023 in [facility] at [facility] from 05/28/2022 in [facility] at [facility] RISK CATEGORY No Risk No Risk    Assessment and Plan: ***  Collaboration of Care: [facility] OP Collaboration of Care:21014065}  Patient/Guardian was advised Release of Information must be obtained prior to any record release in order to collaborate their care with an outside provider. Patient/Guardian was advised if they have not already done so to contact the registration department to sign all necessary forms in order for us  to release information regarding their care.   Consent: Patient/Guardian gives verbal consent for treatment and assignment of benefits for services provided during this visit. Patient/Guardian expressed understanding and agreed to proceed.   Zane FORBES Bach, NP 10/15/20258:10 AM

## 2024-10-09 ENCOUNTER — Other Ambulatory Visit: Payer: Self-pay | Admitting: Licensed Clinical Social Worker

## 2024-10-09 NOTE — Patient Instructions (Signed)
 Visit Information  Thank you for taking time to visit with me today. Please don't hesitate to contact me if I can be of assistance to you before our next scheduled appointment.  Your next care management appointment is by telephone on 10/31/24 at 330  Telephone follow-up in 1 month  Please call the care guide team at [phone] if you need to cancel, schedule, or reschedule an appointment.   Please call the Suicide and Crisis Lifeline: 988 call the [place]  National Suicide Prevention Lifeline: 8731945958 or TTY: (215)022-2394 TTY (401)267-1042) to talk to a trained counselor call 1-800-273-TALK (toll free, 24 hour hotline) go to [facility] [address] [place] [phone]) call 911 if you are experiencing a Mental Health or Behavioral Health Crisis or need someone to talk to.  Lyle Rung, BSW, MSW, LCSW Licensed Clinical Social Worker [employer]   [email or web address] Direct Dial: [phone]

## 2024-10-09 NOTE — Patient Outreach (Signed)
 Complex Care Management   Visit Note  10/09/2024  Name:  Fred Reyes MRN: [id number] DOB: [birth year]  Situation: Referral received for Complex Care Management related to Mental/Behavioral Health diagnosis -stress management needs related to sadness and anxiety symptom.  I obtained verbal consent from Patient.  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   Thickened nails 08/23/2023   Type 2 diabetes mellitus without complication, without long-term current use of insulin  (HCC) 04/16/2019    Assessment: Patient Reported Symptoms:  Cognitive Cognitive Status: Able to follow simple commands, Normal speech and language skills, Alert and oriented to person, place, and time Cognitive/Intellectual Conditions Management [RPT]: None reported or documented in medical history or problem list   Health Maintenance Behaviors: Annual physical exam, Immunizations Healing Pattern: Average Health Facilitated by: Rest, Stress management  Neurological Neurological Review of Symptoms: Headaches Neurological Management Strategies: Coping strategies, Medication therapy, Routine screening Neurological Self-Management Outcome: 4 (good)  HEENT HEENT Symptoms Reported: Not assessed HEENT Management Strategies: Routine screening    Cardiovascular Cardiovascular Symptoms Reported: Swelling in legs or feet Does patient have uncontrolled Hypertension?: Yes Is patient checking Blood Pressure at home?: No Cardiovascular Management Strategies: Medication therapy, Routine screening, Coping strategies Cardiovascular Self-Management Outcome: 3  (uncertain)  Respiratory Respiratory Symptoms Reported: Dry cough Respiratory Management Strategies: Routine screening Respiratory Self-Management Outcome: 3 (uncertain)  Endocrine Endocrine Symptoms Reported: Headaches, Increased urination Is patient diabetic?: Yes Is patient checking blood sugars at home?: No Endocrine Self-Management Outcome: 3 (uncertain)  Gastrointestinal Gastrointestinal Symptoms Reported: No symptoms reported      Genitourinary Genitourinary Symptoms Reported: No symptoms reported    Integumentary Integumentary Symptoms Reported: Not assessed    Musculoskeletal Musculoskelatal Symptoms Reviewed: No symptoms reported        Psychosocial Psychosocial Symptoms Reported: Sadness - if selected complete PHQ 2-9 Behavioral Management Strategies: Adequate rest, Coping strategies Behavioral Health Self-Management Outcome: 3 (uncertain) Major Change/Loss/Stressor/Fears (CP): Resources Techniques to [employer] with Loss/Stress/Change: Diversional activities Quality of Family Relationships: helpful, involved Do you feel physically threatened by others?: No    10/09/2024    PHQ2-9 Depression Screening   Little interest or pleasure in doing things Not at all  Feeling down, depressed, or hopeless Several days  PHQ-2 - Total Score 1  Trouble falling or staying asleep, or sleeping too much    Feeling tired or having little energy    Poor appetite or overeating     Feeling bad about yourself - or that you are a failure or have let yourself or your family down    Trouble concentrating on things, such as reading the newspaper or watching television    Moving or speaking so slowly that other people could have noticed.  Or the opposite - being so fidgety or restless that you have been moving around a lot more than usual    Thoughts that you would be better off dead, or hurting yourself in some way    PHQ2-9 Total Score    If you checked off any problems, how difficult have these  problems made it for you to do your work, take  care of things at home, or get along with other people    Depression Interventions/Treatment      There were no vitals filed for this visit.  Medications Reviewed Today     Reviewed by Merlynn Lyle CROME, LCSW (Social Worker) on 10/09/24 at 1241  Med List Status: <None>   Medication Order Taking? Sig Documenting Provider Last Dose Status Informant  atorvastatin  (LIPITOR) 40 MG tablet [id number]  Take 1 tablet (40 mg total) by mouth daily. Tharon Lung, MD  Active   Blood Glucose Monitoring Suppl w/Device KIT [id number]  Check blood sugar once daily BEFORE breakfast. Tharon Lung, MD  Active   Continuous Glucose Sensor (FREESTYLE LIBRE 3 PLUS SENSOR) MISC [id number]  Change sensor every 15 days. McDiarmid, Krystal BIRCH, MD  Active   glucose blood test strip [id number]  Use to check blood sugar once daily BEFORE breakfast. Tharon Lung, MD  Active   Insulin  Pen Needle (PEN NEEDLES) 32G X 5 MM MISC [id number]  1 Container by Does not apply route as needed. McDiarmid, Krystal BIRCH, MD  Active   Lancets MISC [id number]  Use to check blood sugar once daily BEFORE breakfast. Tharon Lung, MD  Active   metFORMIN  (GLUCOPHAGE -XR) 750 MG 24 hr tablet [id number]  Take 2 tablets (1,500 mg total) by mouth daily with breakfast. Tharon Lung, MD  Active   olmesartan -hydrochlorothiazide (BENICAR  HCT) 20-12.5 MG tablet [id number]  Take 1 tablet by mouth daily. Tharon Lung, MD  Active   Semaglutide ,0.25 or 0.5MG /DOS, (OZEMPIC , 0.25 OR 0.5 MG/DOSE,) 2 MG/1.5ML SOPN [id number]  Inject 0.25 mg into the skin once a week. Tharon Lung, MD  Active   SEMGLEE , YFGN, 100 UNIT/ML Pen [id number]  Inject 14-50 Units into the skin daily. Increase dose as instructed. Tharon Lung, MD  Active   triamcinolone  ointment (KENALOG ) 0.1 % [id number]  Apply 1 Application topically 2 (two) times daily. Apply until skin lightening is achieved. Tharon Lung, MD  Active             Recommendation:   PCP  Follow-up Continue Current Plan of Care  Follow Up Plan:   Telephone follow-up in 1 month  Lyle Merlynn, BSW, MSW, LCSW Licensed Clinical Social Worker [employer] [facility]   [facility] [email or web address] Direct Dial: [phone]

## 2024-10-13 ENCOUNTER — Ambulatory Visit (INDEPENDENT_AMBULATORY_CARE_PROVIDER_SITE_OTHER): Admitting: Neurology

## 2024-10-13 ENCOUNTER — Encounter: Payer: Self-pay | Admitting: Neurology

## 2024-10-13 VITALS — BP 149/88 | HR 86 | Ht 68.0 in | Wt 348.4 lb

## 2024-10-13 DIAGNOSIS — G4719 Other hypersomnia: Secondary | ICD-10-CM | POA: Diagnosis not present

## 2024-10-13 DIAGNOSIS — R030 Elevated blood-pressure reading, without diagnosis of hypertension: Secondary | ICD-10-CM

## 2024-10-13 DIAGNOSIS — R519 Headache, unspecified: Secondary | ICD-10-CM

## 2024-10-13 DIAGNOSIS — R351 Nocturia: Secondary | ICD-10-CM

## 2024-10-13 DIAGNOSIS — G4733 Obstructive sleep apnea (adult) (pediatric): Secondary | ICD-10-CM

## 2024-10-13 DIAGNOSIS — Z6841 Body Mass Index (BMI) 40.0 and over, adult: Secondary | ICD-10-CM

## 2024-10-13 NOTE — Progress Notes (Signed)
 Subjective:    Patient ID: Fred Reyes is a 33 y.o. male.  HPI    True Mar, MD, PhD [facility] [address], [address] [address] [place], [place] [address]  Dear Dr. Tharon,  I saw your patient, Fred Reyes, upon your kind request in my sleep clinic today for evaluation of his prior diagnosis of obstructive sleep apnea.  The patient is unaccompanied today.  As you know, Fred Reyes is a 33 year old male with an underlying medical history of diabetes, hyperlipidemia, hypertension, mitral valve degeneration, LVH, anxiety, depression, constipation, asthma, and morbid obesity with a BMI of over 50, who reports snoring and excessive daytime somnolence, as well as witnessed apneas per wife's observation.  His Epworth sleepiness score is 19 out of 24, fatigue severity score is 59 out of 63.  I reviewed your office note from 08/12/2024.   He does not currently work.  He does not currently have a license.  He lives with his family including wife and 2-year-old daughter.  They have no four-legged pets in the household, he goes to bed generally between 11 and 1 AM and rise time is generally around 5.  He has occasional morning headaches.  He has nocturia about 5 times per average night.  He would be willing to get reevaluated and start PAP therapy.  He drinks caffeine in the form of coffee, 1 or 2 a day, occasional soda.  He quit smoking about 4 or 5 years ago.  He does not drink any alcohol.  He has a TV in the bedroom but it is typically not on at night. I had evaluated him for sleep apnea concern nearly 5 years ago. His home sleep test from 01/14/20 had shown severe obstructive sleep apnea with a total AHI of 64.1/hour and O2 nadir of 80%.  He had a subsequent CPAP titration study on 02/12/2020 at which time he had resolution of his sleep apnea with standard BiPAP therapy of 21/16 cm.  He did not start BiPAP therapy.  He was lost to follow-up since then.  His weight has been  more or less stable.  Previously:  01/01/2020: 33 year old right-handed gentleman with an underlying medical history of hypertension, myxomatous degeneration of the mitral valve, depression, LVH, LE edema, foot pain, anxiety, asthma, allergies, type 2 diabetes and morbid obesity with a BMI of over 50, who reports snoring and excessive daytime somnolence. I reviewed your office note from 12/04/2019. His Epworth sleepiness score is 18 out of 24, fatigue severity score is 40 out of 63.  He lives with his wife and 67-year-old daughter.  He reports that his snoring can be loud and wife confirmed over speaker phone in the beginning of our appointment.  She also reported that he used to have pauses in his breathing but not lately.  The patient reports that his weight has been fluctuating.  As I understand, he has declined bariatric referral.  He reports no smoking, he quit some years ago, he drinks caffeine daily in the form of soda and sweet tea, several servings per day.  He works for a [employer], in [employer].  He reports longer standing history of foot pain bilaterally, right much worse than left as well as swelling on the right side.  He reports that he had x-rays done and also ultrasound.  He has seen a cardiologist.  He wakes up with headaches at times.  He gets up to use the bathroom at night 2-3 times on  average.  He is not aware of any family history as he is adopted.  His bedtime is between 11 and midnight and rise time around 630.  He does not wake up rested.  He has dozed off at work even.    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    Thickened nails 08/23/2023   Type 2 diabetes mellitus without complication, without long-term current use of insulin  (HCC) 04/16/2019    His Past Surgical History Is Significant For: Past Surgical History:  Procedure Laterality Date   NO PAST SURGERIES      His Family History Is Significant For: Family History  Adopted: Yes  Problem Relation Age of Onset   Sleep apnea Neg Hx     His Social History Is Significant For: Social History   Socioeconomic History   Marital status: Married    Spouse name: Lela Lento   Number of children: 1   Years of education: Not on file   Highest education level: 8th grade  Occupational History   Not on file  Tobacco Use   Smoking status: Former    Current packs/day: 0.50    Average packs/day: 0.5 packs/day for 2.0 years (1.0 ttl pk-yrs)    Types: Cigarettes   Smokeless tobacco: Never  Vaping Use   Vaping status: Former  Substance and Sexual Activity   Alcohol use: Not Currently    Alcohol/week: 7.0 standard drinks of alcohol    Types: 7 Cans of beer per week   Drug use: Not Currently    Types: Marijuana, Cocaine    Comment: no MJ or cocaine use in about 2 years. previously used cocaine about once/month   Sexual activity: Yes  Other Topics Concern   Not on file  Social History Narrative   Pt lives with family    Pt doesn't work    Social Drivers of Corporate Investment Banker Strain: High Risk (08/29/2024)   Overall Financial Resource Strain (CARDIA)    Difficulty of Paying Living Expenses: Very hard  Food Insecurity: Food Insecurity Present (08/29/2024)   Hunger Vital Sign    Worried About Running Out of Food in the Last Year: Often true    Ran Out of Food in the Last Year: Often true  Transportation Needs: Unmet Transportation Needs (08/29/2024)   PRAPARE - Administrator, Civil Service (Medical): Yes    Lack of Transportation (Non-Medical): Yes  Physical Activity: Inactive (07/19/2024)   Exercise Vital Sign     Days of Exercise per Week: 7 days    Minutes of Exercise per Session: 0 min  Stress: No Stress Concern Present (10/09/2024)   Fred Reyes of Occupational Health - Occupational Stress Questionnaire    Feeling of Stress: Only a little  Social Connections: Socially Integrated (07/19/2024)   Social Connection and Isolation Panel    Frequency of Communication with Friends and Family: More than three times a week    Frequency of Social Gatherings with Friends and Family: More than three times a week    Attends Religious Services: 1 to 4 times per year  Active Member of Clubs or Organizations: Yes    Attends Banker Meetings: Never    Marital Status: Married    His Allergies Are:  No Known Allergies:   His Current Medications Are:  Outpatient Encounter Medications as of 10/13/2024  Medication Sig   atorvastatin  (LIPITOR) 40 MG tablet Take 1 tablet (40 mg total) by mouth daily.   Blood Glucose Monitoring Suppl w/Device KIT Check blood sugar once daily BEFORE breakfast.   Continuous Glucose Sensor (FREESTYLE LIBRE 3 PLUS SENSOR) MISC Change sensor every 15 days.   glucose blood test strip Use to check blood sugar once daily BEFORE breakfast.   Insulin  Pen Needle (PEN NEEDLES) 32G X 5 MM MISC 1 Container by Does not apply route as needed.   Lancets MISC Use to check blood sugar once daily BEFORE breakfast.   metFORMIN  (GLUCOPHAGE -XR) 750 MG 24 hr tablet Take 2 tablets (1,500 mg total) by mouth daily with breakfast.   olmesartan -hydrochlorothiazide (BENICAR  HCT) 20-12.5 MG tablet Take 1 tablet by mouth daily.   Semaglutide ,0.25 or 0.5MG /DOS, (OZEMPIC , 0.25 OR 0.5 MG/DOSE,) 2 MG/1.5ML SOPN Inject 0.25 mg into the skin once a week.   SEMGLEE , YFGN, 100 UNIT/ML Pen Inject 14-50 Units into the skin daily. Increase dose as instructed.   triamcinolone  ointment (KENALOG ) 0.1 % Apply 1 Application topically 2 (two) times daily. Apply until skin lightening is achieved.   No  facility-administered encounter medications on file as of 10/13/2024.  :   Review of Systems:  Out of a complete 14 point review of systems, all are reviewed and negative with the exception of these symptoms as listed below:  Review of Systems  Objective:  Neurological Exam  Physical Exam Physical Examination:   Vitals:   10/13/24 0932  BP: (!) 149/88  Pulse: 86   He reports that he has not taken his blood pressure medication yet this morning but will be taking it when he gets home.  General Examination: The patient is a very pleasant 33 y.o. male in no acute distress. He appears well-developed and well-nourished and well groomed.   HEENT: Normocephalic, atraumatic, pupils are equal, round and reactive to light, extraocular tracking is good without limitation to gaze excursion or nystagmus noted. Hearing is grossly intact. Face is symmetric with normal facial animation. Speech is clear with no dysarthria noted. There is no hypophonia. There is no lip, neck/head, jaw or voice tremor. Neck is supple with full range of passive and active motion. There are no carotid bruits on auscultation. Oropharynx exam reveals: mild mouth dryness, adequate dental hygiene and moderate airway crowding, due to thicker tongue, Mallampati class III, tonsils about 1+ bilaterally, larger uvula.  Tongue protrudes centrally and palate elevates symmetrically.  Mild to moderate overbite, neck circumference 19-1/4 inches.   Chest: Clear to auscultation without wheezing, rhonchi or crackles noted.   Heart: S1+S2+0, regular and normal without murmurs, rubs or gallops noted.    Abdomen: Soft, non-tender and non-distended with normal bowel sounds appreciated on auscultation.   Extremities: There some puffiness in the lower extremities.      Skin: Warm and dry without trophic changes noted.    Musculoskeletal: exam reveals no obvious joint deformities, tenderness or joint swelling or erythema.    Neurologically:   Mental status: The patient is awake, alert and oriented in all 4 spheres. His immediate and remote memory, attention, language skills and fund of knowledge are appropriate. There is no evidence of aphasia, agnosia, apraxia or anomia. Speech is  clear with normal prosody and enunciation. Thought process is linear. Mood is normal and affect is normal.  Cranial nerves II - XII are as described above under HEENT exam.  Motor exam: Normal bulk, strength and tone is noted. There is no obvious action or resting tremor.  Fine motor skills and coordination: Intact grossly.  Cerebellar testing: No dysmetria or intention tremor. There is no truncal or gait ataxia.  Sensory exam: intact to light touch in the upper and lower extremities.  Gait, station and balance: He stands easily. No veering to one side is noted. No leaning to one side is noted. Posture is age-appropriate and stance is narrow based. Gait shows normal stride length and normal pace. No problems turning are noted.   Assessment and plan:  In summary, Fred Reyes is a very pleasant 33 year old male with an underlying medical history of diabetes, hyperlipidemia, hypertension, mitral valve degeneration, LVH, anxiety, depression, constipation, asthma, and morbid obesity with a BMI of over 50, who presents for evaluation of his obstructive sleep apnea.  He was diagnosed in 2020 with significant obstructive sleep apnea but has not been on CPAP or BiPAP therapy.  He is working on weight loss.  We talked about the diagnosis of sleep apnea, particularly OSA, its prognosis and treatment options. We talked about medical/conservative treatments, surgical interventions and non-pharmacological approaches for symptom control. I explained, in particular, the risks and ramifications of untreated moderate to severe OSA, especially with respect to developing cardiovascular disease down the road, including congestive heart failure (CHF), difficult to treat hypertension,  cardiac arrhythmias (particularly A-fib), neurovascular complications including TIA, stroke and dementia. Even type 2 diabetes has, in part, been linked to untreated OSA. Symptoms of untreated OSA may include (but may not be limited to) daytime sleepiness, nocturia (i.e. frequent nighttime urination), memory problems, mood irritability and suboptimally controlled or worsening mood disorder such as depression and/or anxiety, lack of energy, lack of motivation, physical discomfort, as well as recurrent headaches, especially morning or nocturnal headaches. We talked about the importance of maintaining a healthy lifestyle and striving for healthy weight. In addition, we talked about the importance of striving for and maintaining good sleep hygiene. I recommended a sleep study at this time. I outlined the differences between a laboratory attended sleep study which is considered more comprehensive and accurate over the option of a home sleep test (HST); the latter may lead to underestimation of sleep disordered breathing in some instances and does not help with diagnosing upper airway resistance syndrome and is not accurate enough to diagnose primary central sleep apnea typically. I outlined possible surgical and non-surgical treatment options of OSA, including the use of a positive airway pressure (PAP) device (i.e. CPAP, AutoPAP/APAP or BiPAP in certain circumstances), a custom-made dental device (aka oral appliance, which would require a referral to a specialist dentist or orthodontist typically, and is generally speaking not considered for patients with full dentures or edentulous state), upper airway surgical options, such as traditional UPPP (which is not considered a first-line treatment) or the Inspire device (hypoglossal nerve stimulator, which would involve a referral for consultation with an ENT surgeon, after careful selection, following inclusion criteria - also not first-line treatment). I explained the  PAP treatment option to the patient in detail, as this is generally considered first-line treatment.  The patient indicated that he would be willing to try PAP therapy, if the need arises. I explained the importance of being compliant with PAP treatment, not only for insurance purposes but  primarily to improve patient's symptoms symptoms, and for the patient's long term health benefit, including to reduce His cardiovascular risks longer-term.    We mutually agreed to proceed with a home sleep test at this time. We will pick up our discussion about the next steps and treatment options after testing.  We will keep him posted as to the test results by phone call and/or MyChart messaging where possible.  We will plan to follow-up in sleep clinic accordingly as well.  I answered all his questions today and the patient was in agreement.   I encouraged him to call with any interim questions, concerns, problems or updates or email us  through MyChart.  Generally speaking, sleep test authorizations may take up to 2 weeks, sometimes less, sometimes longer, the patient is encouraged to get in touch with us  if they do not hear back from the sleep lab staff directly within the next 2 weeks.  Thank you very much for allowing me to participate in the care of this nice patient. If I can be of any further assistance to you please do not hesitate to call me at [phone].  Sincerely,   True Mar, MD, PhD

## 2024-10-13 NOTE — Patient Instructions (Signed)

## 2024-10-22 ENCOUNTER — Other Ambulatory Visit: Payer: Self-pay

## 2024-10-22 NOTE — Patient Outreach (Signed)
 Complex Care Management   Visit Note  10/22/2024  Name:  Fred Reyes MRN: [id number] DOB: 12-07-1991  Situation: Referral received for Complex Care Management related to Diabetes with Complications and HTN I obtained verbal consent from Patient.  Visit completed with Patient  on the phone. Call disconnected prior to visit completion. Patient did not answer return call x2, voicemail left requesting return call and message sent via MyChart.  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   Thickened nails 08/23/2023   Type 2 diabetes mellitus without complication, without long-term current use of insulin  (HCC) 04/16/2019    Assessment: Patient Reported Symptoms:  Cognitive Cognitive Status: Able to follow simple commands, Alert and oriented to person, place, and time, Normal speech and language skills Cognitive/Intellectual Conditions Management [RPT]: None reported or documented in medical history or problem list   Health Maintenance Behaviors: Annual physical exam, Immunizations Healing Pattern: Average Health Facilitated by: Healthy diet, Rest, Stress management  Neurological Neurological Review of Symptoms: Not assessed    HEENT HEENT Symptoms Reported: Not assessed      Cardiovascular Cardiovascular Symptoms Reported: Swelling in legs or feet, Other: Other Cardiovascular Symptoms: Patient reports lower extremity swelling seems to be going down. He reports pain and cramping only occur when he is standing for long periods of time Does patient have uncontrolled Hypertension?: Yes Is patient checking Blood Pressure at home?: Yes Patient's Recent BP reading at  home: Patient does not remember exact reading, but systolic has been around 120. He reports he has been using his mother's BP monitor or checking at [facility] Cardiovascular Management Strategies: Medication therapy, Routine screening, Coping strategies  Respiratory Respiratory Symptoms Reported: Not assesed Additional Respiratory Details: Note per chart review patient is to schedule an appointment to pick up at-home sleep test. Respiratory Management Strategies: Routine screening  Endocrine Endocrine Symptoms Reported: Not assessed Is patient diabetic?: Yes Is patient checking blood sugars at home?: No Endocrine Comment: Note per chart review patient has been communicating with Dr. Koval via MyChart message. Dr. Koval is attempting to schedule an appointment for follow-up. Patient reports he thought he responded to most recent message, but I do not see it in MyChart. Advised to log back into his app and respond to message to ensure appointment gets scheduled to address uncontrolled diabetes and CGM  Gastrointestinal Gastrointestinal Symptoms Reported: Not assessed      Genitourinary Genitourinary Symptoms Reported: Not assessed    Integumentary Integumentary Symptoms Reported: Not assessed    Musculoskeletal Musculoskelatal Symptoms Reviewed: Not assessed        Psychosocial Psychosocial Symptoms Reported: Not assessed          10/22/2024    PHQ2-9 Depression Screening   Little interest or pleasure in doing things    Feeling down, depressed, or hopeless    PHQ-2 - Total Score    Trouble falling or staying asleep, or sleeping too much    Feeling tired or having little energy    Poor appetite or overeating     Feeling bad about yourself - or that you are a failure or have let yourself or your family down  Trouble concentrating on things, such as reading the newspaper or watching television    Moving or speaking so slowly that other people could have noticed.  Or the opposite - being  so fidgety or restless that you have been moving around a lot more than usual    Thoughts that you would be better off dead, or hurting yourself in some way    PHQ2-9 Total Score    If you checked off any problems, how difficult have these problems made it for you to do your work, take care of things at home, or get along with other people    Depression Interventions/Treatment      There were no vitals filed for this visit.  Medications Reviewed Today     Reviewed by Arno Rosaline SQUIBB, RN (Registered Nurse) on 10/22/24 at 1220  Med List Status: <None>   Medication Order Taking? Sig Documenting Provider Last Dose Status Informant  atorvastatin  (LIPITOR) 40 MG tablet [id number]  Take 1 tablet (40 mg total) by mouth daily. Tharon Lung, MD  Active   Blood Glucose Monitoring Suppl w/Device KIT [id number]  Check blood sugar once daily BEFORE breakfast. Tharon Lung, MD  Active   Continuous Glucose Sensor (FREESTYLE LIBRE 3 PLUS SENSOR) MISC [id number]  Change sensor every 15 days. McDiarmid, Krystal BIRCH, MD  Active   glucose blood test strip [id number]  Use to check blood sugar once daily BEFORE breakfast. Tharon Lung, MD  Active   Insulin  Pen Needle (PEN NEEDLES) 32G X 5 MM MISC [id number]  1 Container by Does not apply route as needed. McDiarmid, Krystal BIRCH, MD  Active   Lancets MISC [id number]  Use to check blood sugar once daily BEFORE breakfast. Tharon Lung, MD  Active   metFORMIN  (GLUCOPHAGE -XR) 750 MG 24 hr tablet [id number]  Take 2 tablets (1,500 mg total) by mouth daily with breakfast. Tharon Lung, MD  Active   olmesartan -hydrochlorothiazide (BENICAR  HCT) 20-12.5 MG tablet [id number]  Take 1 tablet by mouth daily. Tharon Lung, MD  Active   Semaglutide ,0.25 or 0.5MG /DOS, (OZEMPIC , 0.25 OR 0.5 MG/DOSE,) 2 MG/1.5ML SOPN [id number]  Inject 0.25 mg into the skin once a week. Tharon Lung, MD  Active   SEMGLEE , YFGN, 100 UNIT/ML Pen [id number]  Inject 14-50 Units into the skin daily. Increase dose as  instructed. Tharon Lung, MD  Active   triamcinolone  ointment (KENALOG ) 0.1 % [id number]  Apply 1 Application topically 2 (two) times daily. Apply until skin lightening is achieved. Tharon Lung, MD  Active             Recommendation:   Specialty provider follow-up schedule follow-up with Dr. Koval Continue Current Plan of Care  Follow Up Plan:   Telephone follow up appointment date/time:  10/29/24 at 11 AM to complete follow-up visit  Rosaline Arno, RN MSN [place]  [facility] Health RN Care Manager Direct Dial: [phone]  Fax: [phone]

## 2024-10-22 NOTE — Patient Instructions (Signed)
 Visit Information  Mr. Mcclinton was given information about Medicaid Managed Care team care coordination services as a part of their [employer] Medicaid benefit.   If you would like to schedule transportation through your [employer] [facility] plan, please call the following number at least 2 days in advance of your appointment: [phone]  If you are experiencing a behavioral health crisis, call the [employer] [place]  Behavioral Health Crisis Line at [phone]). The line is available 24 hours a day, seven days a week.  I'm sorry our call was disconnected today. I tried to call you back twice, but was unsuccessful in reaching you. Please contact me at [phone] so we can complete our visit. Otherwise, I have scheduled you for 10/29/24 at 11 AM.  Care plan and visit instructions communicated with the patient verbally today. Patient agrees to receive a copy in MyChart. Active MyChart status and patient understanding of how to access instructions and care plan via MyChart confirmed with patient.     Telephone follow up appointment with Managed Medicaid care management team member scheduled for: 10/29/24 at 11 AM  Rosaline Finlay, RN MSN [place]  [facility] Health RN Care Manager Direct Dial: [phone]  Fax: [phone]   Following is a copy of your plan of care:  There are no care plans that you recently modified to display for this patient.

## 2024-10-29 ENCOUNTER — Other Ambulatory Visit: Payer: Self-pay

## 2024-10-29 NOTE — Patient Instructions (Signed)
 Visit Information  Mr. Stetzer was given information about Medicaid Managed Care team care coordination services as a part of their [employer] Medicaid benefit.   If you would like to schedule transportation through your [facility] plan, please call the following number at least 2 days in advance of your appointment: [phone]  If you are experiencing a behavioral health crisis, call the [employer] [place]  Behavioral Health Crisis Line at [phone]). The line is available 24 hours a day, seven days a week.   Care plan and visit instructions communicated with the patient verbally today. Patient agrees to receive a copy in MyChart. Active MyChart status and patient understanding of how to access instructions and care plan via MyChart confirmed with patient.     I am sorry we were not able to complete our visit today. I tried to call back but was unsuccessful in reaching you. Please be on the lookout for a return call from sleep medicine to schedule a time to pick up the at-home sleep study device. I have also sent a message to Dr. Koval to request a list of available dates/times for follow-up. I will reach out to you again 11/05/24 at 10 AM to complete follow-up visit.  Rosaline Finlay, RN MSN [place]  [facility] RN Care Manager Direct Dial: [phone]  Fax: [phone]   Following is a copy of your plan of care:  There are no care plans that you recently modified to display for this patient.

## 2024-10-29 NOTE — Patient Outreach (Signed)
 Complex Care Management   Visit Note  10/29/2024  Name:  Fred Reyes MRN: [id number] DOB: 03-30-1991  Situation: Referral received for Complex Care Management related to Diabetes with Complications and HTN I obtained verbal consent from Patient.  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   Thickened nails 08/23/2023   Type 2 diabetes mellitus without complication, without long-term current use of insulin  (HCC) 04/16/2019    Assessment: Patient Reported Symptoms:  Cognitive Cognitive Status: Able to follow simple commands, Alert and oriented to person, place, and time, Normal speech and language skills Cognitive/Intellectual Conditions Management [RPT]: None reported or documented in medical history or problem list   Health Maintenance Behaviors: Annual physical exam, Immunizations Health Facilitated by: Healthy diet, Rest, Stress management  Neurological Neurological Review of Symptoms: Not assessed    HEENT HEENT Symptoms Reported: Not assessed      Cardiovascular Cardiovascular Symptoms Reported: Swelling in legs or feet, Other: Other Cardiovascular Symptoms: Patient reports he continues to wear compression socks for swelling, it's not too bad Does patient have uncontrolled Hypertension?: Yes Is patient checking Blood Pressure at home?: Yes Patient's Recent BP reading at home: Patient reports he has been keeping a log of BP on his wife's phone, but he does not have access to the log and does not remember what recent readings have been. Reminded patient of goal BP of <130/80. Patient reports he thinks it has  been around goal. Advised to bring log to upcoming cardiology appointment Cardiovascular Management Strategies: Medication therapy, Routine screening, Coping strategies  Respiratory Respiratory Symptoms Reported: No symptoms reported Additional Respiratory Details: Note per chart review patient is to schedule an appointment to pick up at-home sleep test. 3-way call placed to [facility] to try to schedule an appointment to pick up device, message left requesting that they call patient back to reschedule. Message sent to NT from sleep medicine office to request someone reach out to patient. Respiratory Management Strategies: Routine screening  Endocrine Endocrine Symptoms Reported: Not assessed Is patient diabetic?: Yes Is patient checking blood sugars at home?: No Endocrine Comment: Patient reports he has not tried to reach out to Dr. Koval since we last spoke. 3-way call placed to Dr. Rennis office but patient hangs up before connecting with a representative. Message sent to Dr. Amalia requesting available dates/times.  Gastrointestinal Gastrointestinal Symptoms Reported: Not assessed      Genitourinary Genitourinary Symptoms Reported: Not assessed    Integumentary Integumentary Symptoms Reported: Not assessed    Musculoskeletal Musculoskelatal Symptoms Reviewed: No symptoms reported   Falls in the past year?: No Number of falls in past year: 1 or less Was there an injury with Fall?: No Fall Risk Category Calculator: 0 Patient Fall Risk Level: Low Fall Risk Patient at Risk for Falls Due to: No Fall Risks Fall risk Follow up: Falls evaluation completed, Education provided, Falls prevention discussed  Psychosocial Psychosocial Symptoms Reported: Not assessed          10/29/2024    PHQ2-9 Depression Screening   Little interest or pleasure in doing things    Feeling down,  depressed, or hopeless    PHQ-2 - Total Score    Trouble falling or staying asleep, or  sleeping too much    Feeling tired or having little energy    Poor appetite or overeating     Feeling bad about yourself - or that you are a failure or have let yourself or your family down    Trouble concentrating on things, such as reading the newspaper or watching television    Moving or speaking so slowly that other people could have noticed.  Or the opposite - being so fidgety or restless that you have been moving around a lot more than usual    Thoughts that you would be better off dead, or hurting yourself in some way    PHQ2-9 Total Score    If you checked off any problems, how difficult have these problems made it for you to do your work, take care of things at home, or get along with other people    Depression Interventions/Treatment      There were no vitals filed for this visit. Pain Scale: 0-10 Pain Score: 0-No pain  Medications Reviewed Today     Reviewed by Arno Rosaline SQUIBB, RN (Registered Nurse) on 10/29/24 at 1116  Med List Status: <None>   Medication Order Taking? Sig Documenting Provider Last Dose Status Informant  atorvastatin  (LIPITOR) 40 MG tablet [id number]  Take 1 tablet (40 mg total) by mouth daily. Tharon Lung, MD  Active   Blood Glucose Monitoring Suppl w/Device KIT [id number]  Check blood sugar once daily BEFORE breakfast. Tharon Lung, MD  Active   Continuous Glucose Sensor (FREESTYLE LIBRE 3 PLUS SENSOR) MISC [id number]  Change sensor every 15 days. McDiarmid, Krystal BIRCH, MD  Active   glucose blood test strip [id number]  Use to check blood sugar once daily BEFORE breakfast. Tharon Lung, MD  Active   Insulin  Pen Needle (PEN NEEDLES) 32G X 5 MM MISC [id number]  1 Container by Does not apply route as needed. McDiarmid, Krystal BIRCH, MD  Active   Lancets MISC [id number]  Use to check blood sugar once daily BEFORE breakfast. Tharon Lung, MD  Active   metFORMIN  (GLUCOPHAGE -XR) 750 MG 24 hr tablet [id number]  Take 2 tablets (1,500 mg total) by mouth daily with breakfast. Tharon Lung, MD  Active   olmesartan -hydrochlorothiazide (BENICAR  HCT) 20-12.5 MG tablet [id number]  Take 1 tablet by mouth daily. Tharon Lung, MD  Active   Semaglutide ,0.25 or 0.5MG /DOS, (OZEMPIC , 0.25 OR 0.5 MG/DOSE,) 2 MG/1.5ML SOPN [id number]  Inject 0.25 mg into the skin once a week. Tharon Lung, MD  Active   SEMGLEE , YFGN, 100 UNIT/ML Pen [id number]  Inject 14-50 Units into the skin daily. Increase dose as instructed. Tharon Lung, MD  Active   triamcinolone  ointment (KENALOG ) 0.1 % [id number]  Apply 1 Application topically 2 (two) times daily. Apply until skin lightening is achieved. Tharon Lung, MD  Active             Recommendation:   Specialty provider follow-up Dr. Koval for diabetes management Continue Current Plan of Care Sleep medicine to schedule appointment to pick up at-home sleep study device  Follow Up Plan:   Telephone follow up appointment date/time:  11/05/24 at 10 AM  Rosaline Arno, RN MSN   [facility] Health RN Care Manager Direct Dial: [phone]  Fax: [phone]

## 2024-10-31 ENCOUNTER — Other Ambulatory Visit: Payer: Self-pay | Admitting: Licensed Clinical Social Worker

## 2024-11-01 NOTE — Patient Outreach (Signed)
 Complex Care Management   Visit Note  10/31/2024  Name:  Fred Reyes MRN: [id number] DOB: 04/29/1991  Situation: Referral received for Complex Care Management related to Mental/Behavioral Health diagnosis depression. I obtained verbal consent from Patient.  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   Thickened nails 08/23/2023   Type 2 diabetes mellitus without complication, without long-term current use of insulin  (HCC) 04/16/2019    Assessment: Patient Reported Symptoms:  Cognitive Cognitive Status: Able to follow simple commands, Alert and oriented to person, place, and time, Normal speech and language skills      Neurological Neurological Review of Symptoms: Other: [place] Neurological Symptoms/Conditions [RPT]: Occasional tingling in feet Neurological Management Strategies: Coping strategies, Medication therapy, Routine screening Neurological Self-Management Outcome: 4 (good)  HEENT HEENT Symptoms Reported: No symptoms reported HEENT Management Strategies: Routine screening HEENT Self-Management Outcome: 3 (uncertain)    Cardiovascular Cardiovascular Symptoms Reported: Swelling in legs or feet Does patient have uncontrolled Hypertension?: Yes Is patient checking Blood Pressure at home?: Yes Cardiovascular Management Strategies: Medication therapy, Routine screening, Coping strategies Cardiovascular Self-Management Outcome: 3 (uncertain)  Respiratory    Not assessed  Endocrine  Not assessed     Gastrointestinal Gastrointestinal Symptoms Reported: Not assessed      Genitourinary Genitourinary Symptoms  Reported: Not assessed    Integumentary    Not assessed  Musculoskeletal Musculoskelatal Symptoms Reviewed: No symptoms reported Musculoskeletal Management Strategies: Routine screening Musculoskeletal Self-Management Outcome: 4 (good)      Psychosocial Psychosocial Symptoms Reported: Sadness - if selected complete PHQ 2-9 Behavioral Management Strategies: Adequate rest, Coping strategies Behavioral Health Self-Management Outcome: 3 (uncertain) Major Change/Loss/Stressor/Fears (CP): Resources Techniques to [employer] with Loss/Stress/Change: Diversional activities Quality of Family Relationships: involved, helpful Do you feel physically threatened by others?: No    11/01/2024    PHQ2-9 Depression Screening   Little interest or pleasure in doing things Not at all  Feeling down, depressed, or hopeless Several days  PHQ-2 - Total Score 1  Trouble falling or staying asleep, or sleeping too much    Feeling tired or having little energy    Poor appetite or overeating     Feeling bad about yourself - or that you are a failure or have let yourself or your family down    Trouble concentrating on things, such as reading the newspaper or watching television    Moving or speaking so slowly that other people could have noticed.  Or the opposite - being so fidgety or restless that you have been moving around a lot more than usual    Thoughts that you would be better off dead, or hurting yourself in some way    PHQ2-9 Total Score    If you checked off any problems, how difficult have these problems made it for you to do your work, take care of things at home, or get along with other people    Depression Interventions/Treatment      There were no vitals filed for this visit.    Medications Reviewed Today     Reviewed by Merlynn Lyle CROME, LCSW (Social Worker)  on 11/01/24 at 1946  Med List Status: <None>   Medication Order Taking? Sig Documenting Provider Last Dose Status Informant  atorvastatin   (LIPITOR) 40 MG tablet [id number]  Take 1 tablet (40 mg total) by mouth daily. Tharon Lung, MD  Active   Blood Glucose Monitoring Suppl w/Device KIT [id number]  Check blood sugar once daily BEFORE breakfast. Tharon Lung, MD  Active   Continuous Glucose Sensor (FREESTYLE LIBRE 3 PLUS SENSOR) MISC [id number]  Change sensor every 15 days. McDiarmid, Krystal BIRCH, MD  Active   glucose blood test strip [id number]  Use to check blood sugar once daily BEFORE breakfast. Tharon Lung, MD  Active   Insulin  Pen Needle (PEN NEEDLES) 32G X 5 MM MISC [id number]  1 Container by Does not apply route as needed. McDiarmid, Krystal BIRCH, MD  Active   Lancets MISC [id number]  Use to check blood sugar once daily BEFORE breakfast. Tharon Lung, MD  Active   metFORMIN  (GLUCOPHAGE -XR) 750 MG 24 hr tablet [id number]  Take 2 tablets (1,500 mg total) by mouth daily with breakfast. Tharon Lung, MD  Active   olmesartan -hydrochlorothiazide (BENICAR  HCT) 20-12.5 MG tablet [id number]  Take 1 tablet by mouth daily. Tharon Lung, MD  Active   Semaglutide ,0.25 or 0.5MG /DOS, (OZEMPIC , 0.25 OR 0.5 MG/DOSE,) 2 MG/1.5ML SOPN [id number]  Inject 0.25 mg into the skin once a week. Tharon Lung, MD  Active   SEMGLEE , YFGN, 100 UNIT/ML Pen [id number]  Inject 14-50 Units into the skin daily. Increase dose as instructed. Tharon Lung, MD  Active   triamcinolone  ointment (KENALOG ) 0.1 % [id number]  Apply 1 Application topically 2 (two) times daily. Apply until skin lightening is achieved. Tharon Lung, MD  Active             Recommendation:   PCP Follow-up Specialty provider follow-up [facility] psychiatry  Continue Current Plan of Care  Follow Up Plan:   Telephone follow-up in 1 month  Lyle Rung, BSW, MSW, LCSW Licensed Clinical Social Worker [employer]   [email or web address] Direct Dial: [phone]

## 2024-11-01 NOTE — Patient Instructions (Signed)
 Visit Information  Thank you for taking time to visit with me today. Please don't hesitate to contact me if I can be of assistance to you before our next scheduled appointment.  Your next care management appointment is by telephone on 11/24/24 at 3:15 pm  Telephone follow-up in 1 month  Please call the care guide team at [phone] if you need to cancel, schedule, or reschedule an appointment.   Please call the Suicide and Crisis Lifeline: 988 call the [place]  National Suicide Prevention Lifeline: 651-106-1427 or TTY: 863 498 5352 TTY 9525194813) to talk to a trained counselor call 1-800-273-TALK (toll free, 24 hour hotline) go to [facility] [address], [place] [phone]) call 911 if you are experiencing a Mental Health or Behavioral Health Crisis or need someone to talk to.  Lyle Rung, BSW, MSW, LCSW Licensed Clinical Social Worker [employer]   [facility] [email or web address] Direct Dial: [phone]

## 2024-11-05 ENCOUNTER — Other Ambulatory Visit: Payer: Self-pay

## 2024-11-05 NOTE — Patient Outreach (Signed)
 Complex Care Management   Visit Note  11/05/2024  Name:  Fred Reyes MRN: [id number] DOB: March 19, 1991  Situation: Referral received for Complex Care Management related to Diabetes with Complications and HTN I obtained verbal consent from Patient.  Visit completed with Patient  on the phone. Patient hangs up shortly after visit begins. Unable to reach x2 additional attempts. 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 Respiratory Management Strategies: Routine screening  Endocrine Endocrine Symptoms Reported: Not assessed Endocrine Comment: Patient reports he still has not heard from Dr. Koval or his office to schedule follow-up appointment.  Gastrointestinal Gastrointestinal Symptoms Reported: Not assessed      Genitourinary Genitourinary Symptoms Reported: Not assessed    Integumentary Integumentary Symptoms Reported: Not assessed    Musculoskeletal Musculoskelatal Symptoms Reviewed: Not assessed         Psychosocial Psychosocial Symptoms Reported: Not assessed          11/05/2024    PHQ2-9 Depression Screening   Little interest or pleasure in doing things    Feeling down, depressed, or hopeless    PHQ-2 - Total Score    Trouble falling or staying asleep, or sleeping too much    Feeling tired or having little energy    Poor appetite or overeating     Feeling bad about yourself - or that you are a failure or have let yourself or your family down    Trouble concentrating on things, such as reading the newspaper or watching television    Moving or speaking so slowly that other people could have noticed.  Or the opposite - being so fidgety or restless that you have been moving around a lot more than usual    Thoughts that you would be better off dead, or hurting yourself in some way    PHQ2-9 Total Score    If you checked off any problems, how difficult have these problems made it for you to do your work, take care of things at home, or get along with other people    Depression Interventions/Treatment      There were no vitals filed for this visit.    Medications Reviewed Today   Medications were not reviewed in this encounter     Recommendation:   Continue Current Plan of Care Schedule appointment with Dr. Koval for diabetes management and sleep medicine to pick up at-home sleep device  Follow Up Plan:   Telephone follow-up in 1 day  Rosaline Finlay, RN MSN [place]  [facility] Health RN Care  Insurance Underwriter Dial: [phone]  Fax: [phone]

## 2024-11-05 NOTE — Patient Instructions (Signed)
 Visit Information  Fred Reyes was given information about Medicaid Managed Care team care coordination services as a part of their [employer] Medicaid benefit.   If you would like to schedule transportation through your [employer] [facility] plan, please call the following number at least 2 days in advance of your appointment: [phone]  If you are experiencing a behavioral health crisis, call the [employer] [place]  Behavioral Health Crisis Line at [phone]). The line is available 24 hours a day, seven days a week.   Unable to review care plan instructions due to call ending before visit completion.  I am sorry our call was cut short today. I tried to call you back but was unsuccessful in reaching you. Please give me a call back at [phone].  Rosaline Finlay, RN MSN [place]  [facility] Population Health RN Care Manager Direct Dial: [phone]  Fax: [phone]   Following is a copy of your plan of care:  There are no care plans that you recently modified to display for this patient.

## 2024-11-06 NOTE — Patient Outreach (Signed)
 Care Coordination   11/06/2024 Name: Parvin Stetzer MRN: [id number] DOB: [birth year]   Care Coordination Outreach Attempts:  An unsuccessful telephone outreach was attempted today to complete CCM follow-up visit.  Follow Up Plan:  Additional outreach attempts will be made to complete follow-up visit.   Encounter Outcome:  No Answer. HIPAA compliant voicemail left requesting return call. Advised patient to check MyChart messages for message from sleep medicine.   Rosaline Finlay, RN MSN [place]  [facility] Population Health RN Care Manager Direct Dial: [phone]  Fax: [phone]

## 2024-11-06 NOTE — Patient Instructions (Signed)
 [facility] - I am sorry I was unable to reach you today. I work with Tharon Lung, MD and am calling to support your healthcare needs. Please contact me at [phone] at your earliest convenience. I look forward to speaking with you soon.   Thank you,  Rosaline Finlay, RN MSN [place]  [facility] Health RN Care Manager Direct Dial: [phone]  Fax: [phone]

## 2024-11-07 NOTE — Patient Outreach (Signed)
 Care Coordination   11/07/2024 Name: Ronn Smolinsky MRN: [id number] DOB: July 13, 1991   Care Coordination Outreach Attempts:  A second unsuccessful telephone outreach was attempted today to complete CCM follow-up visit.  Follow Up Plan:  Additional outreach attempts will be made to complete follow-up visit.   Encounter Outcome:  No Answer. HIPAA compliant voicemail left requesting return call and advising patient to check MyChart messages.   Rosaline Finlay, RN MSN [place]  [facility] Population Health RN Care Manager Direct Dial: [phone]  Fax: [phone]

## 2024-11-12 NOTE — Patient Instructions (Signed)
 [facility] - I have attempted to call you three times but have been unsuccessful in reaching you. I work with Tharon Lung, MD and am calling to support your healthcare needs. If I can be of assistance to you, please contact me at [phone].     Thank you,  Rosaline Finlay, RN MSN [place]  [facility] Health RN Care Manager Direct Dial: [phone]  Fax: [phone]

## 2024-11-12 NOTE — Patient Outreach (Signed)
 Care Coordination   11/12/2024 Name: Fred Reyes MRN: [id number] DOB: 09-08-1991   Care Coordination Outreach Attempts:  A third unsuccessful telephone outreach was attempted today to complete CCM follow-up visit.  Follow Up Plan:  No further outreach attempts will be made at this time. We have been unable to contact patient to complete follow-up visit.  Encounter Outcome:  No Answer. Mailbox full, unable to leave message. Message sent via MyChart requesting return call.   Rosaline Finlay, RN MSN [place]  [facility] Population Health RN Care Manager Direct Dial: [phone]  Fax: [phone]

## 2024-11-17 ENCOUNTER — Telehealth: Payer: Self-pay | Admitting: Pharmacist

## 2024-11-17 ENCOUNTER — Encounter: Payer: Self-pay | Admitting: Family Medicine

## 2024-11-17 NOTE — Telephone Encounter (Signed)
 Patient contacted for follow-up of CGM request for sample sensor.   Since last contact patient reports he has supply of all his medications.  He reports he is taking things one day at a time  Placed Libre 3+ sensor at front desk for pick-up.  Scheduled appt for follow-up with me in 8 days.   Total time with patient call and documentation of interaction: 12 minutes.

## 2024-11-18 NOTE — Telephone Encounter (Signed)
 Reviewed and agree with Dr Rennis plan.

## 2024-11-19 ENCOUNTER — Ambulatory Visit: Admitting: Neurology

## 2024-11-19 DIAGNOSIS — R519 Headache, unspecified: Secondary | ICD-10-CM

## 2024-11-19 DIAGNOSIS — G4733 Obstructive sleep apnea (adult) (pediatric): Secondary | ICD-10-CM | POA: Diagnosis not present

## 2024-11-19 DIAGNOSIS — G4719 Other hypersomnia: Secondary | ICD-10-CM

## 2024-11-19 DIAGNOSIS — G4731 Primary central sleep apnea: Secondary | ICD-10-CM

## 2024-11-19 DIAGNOSIS — R351 Nocturia: Secondary | ICD-10-CM

## 2024-11-19 DIAGNOSIS — R030 Elevated blood-pressure reading, without diagnosis of hypertension: Secondary | ICD-10-CM

## 2024-11-20 NOTE — Progress Notes (Unsigned)
 Fred Reyes

## 2024-11-24 ENCOUNTER — Telehealth: Payer: Self-pay | Admitting: Licensed Clinical Social Worker

## 2024-11-24 ENCOUNTER — Encounter: Payer: Self-pay | Admitting: Licensed Clinical Social Worker

## 2024-11-24 ENCOUNTER — Ambulatory Visit: Payer: Self-pay | Admitting: Neurology

## 2024-11-24 ENCOUNTER — Telehealth: Payer: Self-pay | Admitting: Family Medicine

## 2024-11-24 DIAGNOSIS — G4733 Obstructive sleep apnea (adult) (pediatric): Secondary | ICD-10-CM

## 2024-11-24 MED ORDER — ZEPBOUND 2.5 MG/0.5ML ~~LOC~~ SOAJ: 2.5000 mg | SUBCUTANEOUS | 0 refills | Status: AC

## 2024-11-24 NOTE — Patient Instructions (Signed)
 [facility] - I am sorry I was unable to reach you today for our scheduled appointment. I work with Tharon Lung, MD and am calling to support your healthcare needs. Please contact me at [phone] at your earliest convenience. I look forward to speaking with you soon.   Thank you,  Lyle Rung, BSW, MSW, LCSW Licensed Clinical Social Worker [employer]   [email or web address] Direct Dial: [phone]

## 2024-11-24 NOTE — Telephone Encounter (Signed)
 Spoke with patient about unsuccessful out reach attempt from care managers.  He states he was not sure who was calling but knew that he had an appointment today, though was unable to make it.  He is amenable to one more try by CCM, if they are able, to connect with care manager resources.  Also spoke to patient about upcoming appointment with cardiology for his LVH and myxomatous mitral valve in the morning.  He is aware of where to go and what time.  He also plans to see Dr. Koval after this appointment for diabetes follow-up.  Unfortunately, he has not been able to take his Ozempic .  We discussed his recent severe sleep apnea diagnosis.  Through shared decision making, I have switched him to Zepbound .  I feel the dual agonism will better benefit him in terms of his elevated BMI, severe OSA, MASLD as seen on CT abdomen and pelvis, and diabetes control.  If insurance prefers, can use Mounjaro, though I feel that Zepbound  may be more affordable for him with his severe sleep apnea diagnosis. Will forward to Dr. Koval and Lavern to provide further suggestions for therapy and insurance approval.  Patient also scheduled with me on 12/09/2024 to further follow-up on other chronic medical conditions.  Patient appreciative.

## 2024-11-24 NOTE — Telephone Encounter (Signed)
-----   Message from True Mar sent at 11/24/2024 12:38 PM EST ----- Urgent set up requested on PAP therapy, due to severe OSA.   Patient referred by primary care, seen by me on 10/13/2024, patient had a HST on 11/19/2024.    Please call and notify the patient that the recent home sleep test showed obstructive sleep apnea in the severe range. I recommend treatment for this in the form of autoPAP, which means, that we  don't have to bring him in for a sleep study with CPAP, but will let him start using a so called autoPAP machine at home, through a DME company (of his choice, or as per insurance requirement). The  DME representative will fit the patient with a mask of choice, educate him on how to use the machine, how to put the mask on, etc. I have placed an order in the chart. Please send the order to a  local DME, talk to patient, send report to referring MD. Please also reinforce the need for compliance with treatment. We will need a FU in sleep clinic for 10 weeks post-PAP set up, please arrange  that with me or one of our NPs. Thanks,   True Mar, MD, PhD [facility])    ----- Message ----- From: Mar True, MD Sent: 11/24/2024  12:36 PM EST To: True Mar, MD

## 2024-11-24 NOTE — Procedures (Signed)
 [facility]  HOME SLEEP TEST (Watch PAT) REPORT  STUDY DATE: 11/19/2024  DOB: 05/11/1991  MRN: [id number]  ORDERING CLINICIAN: True Mar, MD, PhD   REFERRING CLINICIAN: Tharon Lung, MD   CLINICAL INFORMATION/HISTORY (obtained from visit note dated 10/13/2024): 33 year old male with an underlying medical history of diabetes, hyperlipidemia, hypertension, mitral valve degeneration, LVH, anxiety, depression, constipation, asthma, and morbid obesity with a BMI of over 50, who reports snoring and excessive daytime somnolence, as well as witnessed apneas.  Epworth sleepiness score: 19/24.  BMI: 53 kg/m  FINDINGS:   Sleep Summary:   Total Recording Time (hours, min): 7 hours, 0 min  Total Sleep Time (hours, min):  6 hours, 10 min  Percent REM (%):    9.8%   Respiratory Indices:   Calculated pAHI (per hour):  60.3/hour         REM pAHI:    76.6/hour       NREM pAHI: 58.6/hour  Central pAHI: 14.7/hour  Oxygen Saturation Statistics:    Oxygen Saturation (%) Mean: 93%   Minimum oxygen saturation (%):                 82%   O2 Saturation Range (%): 82-99%    O2 Saturation (minutes) <=88%: 11.4 min  Pulse Rate Statistics:   Pulse Mean (bpm):    83/min    Pulse Range (53-118/min)   IMPRESSION:   OSA (obstructive sleep apnea), severe Central Sleep Apnea  RECOMMENDATION:  This home sleep test demonstrates severe obstructive sleep apnea with a total AHI of 60.3/hour and O2 nadir of 82% with time below or at 88% saturation of over 10 minutes for the study.  There was a milder central apnea component as well.  Snoring was detected, in the mild to loud range.  Treatment with positive airway pressure is highly recommended. The patient will be advised to proceed with an autoPAP titration/trial at home. A laboratory attended titration study can be considered in the future for optimization of treatment settings and to improve tolerance and compliance, if  needed, down the road. Alternative treatment options are limited secondary to the severity of the patient's sleep disordered breathing, but may include surgical treatment with an implantable hypoglossal nerve stimulator (in carefully selected candidates, meeting criteria).  Concomitant weight loss is recommended (where clinically appropriate). Please note, that untreated obstructive sleep apnea may carry additional perioperative morbidity. Patients with significant obstructive sleep apnea should receive perioperative PAP therapy and the surgeons and particularly the anesthesiologist should be informed of the diagnosis and the severity of the sleep disordered breathing. The patient should be cautioned not to drive, work at heights, or operate dangerous or heavy equipment when tired or sleepy. Review and reiteration of good sleep hygiene measures should be pursued with any patient. Other causes of the patient's symptoms, including circadian rhythm disturbances, an underlying mood disorder, medication effect and/or an underlying medical problem cannot be ruled out based on this test. Clinical correlation is recommended.  The patient and his referring provider will be notified of the test results. The patient will be seen in follow up in sleep clinic at [facility].  I certify that I have reviewed the raw data recording prior to the issuance of this report in accordance with the standards of the American Academy of Sleep Medicine (AASM).    INTERPRETING PHYSICIAN:   True Mar, MD, PhD Medical Director, [facility]) Diplomat, ABPN (Neurology and Sleep)   [facility]  [address] [place], [place] [address] [phone]

## 2024-11-24 NOTE — Telephone Encounter (Signed)
 Spoke to patient gave sleep study results Pt aware severe OSA and urgent set up . Pt chose [employer] [facility] as DME Gave patient adapt # Made initial f/u 01/2025 with Megan,NP Pt expressed understanding and thanked me for calling.Sent orders to [facility] this afternoon

## 2024-11-24 NOTE — Telephone Encounter (Signed)
-----   Message from Lyle LITTIE Rung sent at 11/24/2024  5:03 PM EST ----- 4th unsuccessful outreach attempt, CCM program closure   Thank you Dr Tharon!  Lyle Rung, BSW, MSW, LCSW Licensed Clinical Social Worker [employer]  [facility] [email or web address] Direct Dial: [phone]

## 2024-11-25 ENCOUNTER — Other Ambulatory Visit (HOSPITAL_COMMUNITY): Payer: Self-pay

## 2024-11-25 ENCOUNTER — Ambulatory Visit: Admitting: Pharmacist

## 2024-11-25 ENCOUNTER — Encounter: Payer: Self-pay | Admitting: Family Medicine

## 2024-11-25 ENCOUNTER — Ambulatory Visit: Attending: Cardiology | Admitting: Cardiology

## 2024-11-25 NOTE — Telephone Encounter (Signed)
 Due to patient's severe obstructive sleep apnea, he qualifies for the use of Zepbound .  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

## 2024-11-26 ENCOUNTER — Encounter (HOSPITAL_COMMUNITY): Payer: Self-pay

## 2024-11-26 ENCOUNTER — Other Ambulatory Visit (HOSPITAL_COMMUNITY): Payer: Self-pay

## 2024-11-27 ENCOUNTER — Other Ambulatory Visit (HOSPITAL_COMMUNITY): Payer: Self-pay

## 2024-11-28 ENCOUNTER — Telehealth: Payer: Self-pay

## 2024-11-28 ENCOUNTER — Other Ambulatory Visit (HOSPITAL_COMMUNITY): Payer: Self-pay

## 2024-11-28 NOTE — Telephone Encounter (Signed)
 Prior authorization submitted for ZEPBOUND  2.5MG  to [facility] MEDICAID via Latent.   Key: [id number]

## 2024-11-28 NOTE — Telephone Encounter (Signed)
 New encounter created for Zepbound  PA.

## 2024-11-28 NOTE — Telephone Encounter (Signed)
 Pharmacy Patient Advocate Encounter  Received notification from [facility] MEDICAID that Prior Authorization for ZEPBOUND  has been DENIED.  Full denial letter will be uploaded to the media tab. See denial reason below.  Your doctor submits medical records (for example, chart notes, laboratory values) confirming you are currently on and will continue lifestyle changes (including structured nutrition and physical activity, unless physical activity is not clinically appropriate).  Recent 11/24/24 note from Dr. Tharon attached. Updated sleep study from 11/19/24 attached. Neuro appt notes from 10/13/24 attached.  Recent note may have to be more specifically worded with patient being on and continuing the lifestyle changes.  PA #/Case ID/Reference #: [id number]

## 2024-12-01 ENCOUNTER — Encounter (HOSPITAL_COMMUNITY): Payer: Self-pay

## 2024-12-01 ENCOUNTER — Ambulatory Visit (HOSPITAL_COMMUNITY): Admitting: Mental Health

## 2024-12-01 ENCOUNTER — Telehealth: Payer: Self-pay | Admitting: Pharmacist

## 2024-12-01 ENCOUNTER — Other Ambulatory Visit (HOSPITAL_COMMUNITY): Payer: Self-pay

## 2024-12-01 NOTE — Telephone Encounter (Signed)
 Contacted insurance. Per rep:   All criteria met EXCEPT specific lifestyle diet modifications (meeting with a dietitian? Calorie limit plan? Etc). Patient must also verbally commit to these changes.  This additional info can be submitted within the next 30 days.

## 2024-12-01 NOTE — Telephone Encounter (Signed)
 Spoke further with patient. He remains motivated to continue lifestyle modifications with dietary changes and increasing exercise as he is able to. He knows that if he is able to lower his BMI, this can help with his lipodermatosclerosis, which can further able him to exercise more and reinforce healthy dietary changes he is committed to make. To help him further, I have referred him to a dietician. I am confident he will be able to succeed with weight loss, glucose control, and sleep apnea improvement with his dedication to nutritional changes, meeting with a dietician, and increasing exercise as able.

## 2024-12-01 NOTE — Telephone Encounter (Signed)
 Patient contacted for follow-up of missed appointment and need for follow.  Since last contact patient reports doing OK.  No longer has any sensors.   Reports taking medications.  Willing to schedule follow-up appointment.  Scheduled for 12/19 at 10:00 AM  Total time with patient call and documentation of interaction: 7 minutes.

## 2024-12-02 ENCOUNTER — Other Ambulatory Visit (HOSPITAL_COMMUNITY): Payer: Self-pay

## 2024-12-05 ENCOUNTER — Ambulatory Visit: Admitting: Pharmacist

## 2024-12-08 ENCOUNTER — Other Ambulatory Visit: Payer: Self-pay | Admitting: Licensed Clinical Social Worker

## 2024-12-08 NOTE — Patient Outreach (Signed)
 Complex Care Management   Visit Note  12/08/2024  Name:  Fred Reyes MRN: [id number] DOB: [birth year]  Situation: Referral received for Complex Care Management related to Mental/Behavioral Health diagnosis depression. I obtained verbal consent from Patient.  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   Thickened nails 08/23/2023   Type 2 diabetes mellitus without complication, without long-term current use of insulin  (HCC) 04/16/2019    Assessment: Patient Reported Symptoms:  Cognitive Cognitive Status: Able to follow simple commands, Alert and oriented to person, place, and time Cognitive/Intellectual Conditions Management [RPT]: None reported or documented in medical history or problem list   Health Maintenance Behaviors: Annual physical exam, Immunizations Healing Pattern: Average Health Facilitated by: Rest, Stress management, Healthy diet  Neurological Neurological Review of Symptoms: No symptoms reported    HEENT HEENT Symptoms Reported: No symptoms reported      Cardiovascular Cardiovascular Symptoms Reported: Swelling in legs or feet Does patient have uncontrolled Hypertension?: Yes    Respiratory Respiratory Symptoms Reported: Dry cough    Endocrine Endocrine Symptoms Reported: Weakness or fatigue Is patient diabetic?: Yes Is patient checking blood sugars at home?: No Endocrine Self-Management Outcome: 3 (uncertain)  Gastrointestinal Gastrointestinal Symptoms Reported: Not assessed      Genitourinary Genitourinary Symptoms Reported: Not assessed    Integumentary Integumentary Symptoms Reported:  Not assessed    Musculoskeletal Musculoskelatal Symptoms Reviewed: Not assessed        Psychosocial Psychosocial Symptoms Reported: Sadness - if selected complete PHQ 2-9, Report of significant loss, deaths, abandonment, traumatic incidents Additional Psychological Details: Patient reports a recent death in the family which contributed to his recent no show at [facility] for talk therapy. Grief emotional support provided and [facility] referral offered. Behavioral Management Strategies: Adequate rest, Coping strategies Behavioral Health Self-Management Outcome: 3 (uncertain) Major Change/Loss/Stressor/Fears (CP): Death of a loved one Techniques to Cope with Loss/Stress/Change: Diversional activities Quality of Family Relationships: helpful, involved Do you feel physically threatened by others?: No    12/09/2024    PHQ2-9 Depression Screening   Little interest or pleasure in doing things Not at all  Feeling down, depressed, or hopeless Several days  PHQ-2 - Total Score 1  Trouble falling or staying asleep, or sleeping too much    Feeling tired or having little energy    Poor appetite or overeating     Feeling bad about yourself - or that you are a failure or have let yourself or your family down    Trouble concentrating on things, such as reading the newspaper or watching television    Moving or speaking so slowly that other people could have noticed.  Or the opposite - being so fidgety or restless that you have been moving around a lot more than usual    Thoughts that you would be better off dead, or hurting yourself in some way    PHQ2-9 Total Score    If you checked off any problems, how difficult have these problems made it for you to do your work, take care of things at home, or get along with other people  Depression Interventions/Treatment      There were no vitals filed for this visit.    Medications Reviewed Today     Reviewed by Merlynn Lyle CROME, LCSW (Social Worker) on  12/09/24 at 0831  Med List Status: <None>   Medication Order Taking? Sig Documenting Provider Last Dose Status Informant  atorvastatin  (LIPITOR) 40 MG tablet [id number]  Take 1 tablet (40 mg total) by mouth daily. Tharon Lung, MD  Active   Blood Glucose Monitoring Suppl w/Device KIT [id number]  Check blood sugar once daily BEFORE breakfast. Tharon Lung, MD  Active   Continuous Glucose Sensor (FREESTYLE LIBRE 3 PLUS SENSOR) MISC [id number]  Change sensor every 15 days. McDiarmid, Krystal BIRCH, MD  Active   glucose blood test strip [id number]  Use to check blood sugar once daily BEFORE breakfast. Tharon Lung, MD  Active   Insulin  Pen Needle (PEN NEEDLES) 32G X 5 MM MISC [id number]  1 Container by Does not apply route as needed. McDiarmid, Krystal BIRCH, MD  Active   Lancets MISC [id number]  Use to check blood sugar once daily BEFORE breakfast. Tharon Lung, MD  Active   metFORMIN  (GLUCOPHAGE -XR) 750 MG 24 hr tablet [id number]  Take 2 tablets (1,500 mg total) by mouth daily with breakfast. Tharon Lung, MD  Active   olmesartan -hydrochlorothiazide (BENICAR  HCT) 20-12.5 MG tablet [id number]  Take 1 tablet by mouth daily. Tharon Lung, MD  Active   SEMGLEE , YFGN, 100 UNIT/ML Pen [id number]  Inject 14-50 Units into the skin daily. Increase dose as instructed. Tharon Lung, MD  Active   tirzepatide  (ZEPBOUND ) 2.5 MG/0.5ML Pen [id number]  Inject 2.5 mg into the skin once a week. Tharon Lung, MD  Active   triamcinolone  ointment (KENALOG ) 0.1 % [id number]  Apply 1 Application topically 2 (two) times daily. Apply until skin lightening is achieved. Tharon Lung, MD  Active             Recommendation:   PCP Follow-up Continue Current Plan of Care  Follow Up Plan:   Telephone follow-up in 1 month  Lyle Merlynn, BSW, MSW, LCSW Licensed Clinical Social Worker [employer] [facility]   [facility] [email or web address] Direct Dial: [phone]

## 2024-12-09 ENCOUNTER — Ambulatory Visit: Payer: Self-pay | Admitting: Family Medicine

## 2024-12-09 NOTE — Patient Instructions (Signed)
 Visit Information  Thank you for taking time to visit with me today. Please don't hesitate to contact me if I can be of assistance to you before our next scheduled appointment.  Our next appointment is by telephone on 01/02/24 at 3 pm Please call the care guide team at [phone] if you need to cancel or reschedule your appointment.   Following is a copy of your care plan:   Goals Addressed             This Visit's Progress    LCSW [facility] Social Work Care Plan        Current barriers:   Mental Health needs related to symptoms of depression, stress and anxiety. Patient requires Support, Education, Resources, Referrals, Advocacy, and Care Coordination, in order to meet Unmet Mental Health Needs and to find a therapist and psychiatrist. Mental Health Concerns and Social Isolation Patient lacks knowledge of local and available community resources and counseling agencies.   Clinical Goal(s): verbalize understanding of plan for management of Anxiety, Depression, and Stress symptoms and demonstrate a reduction in symptoms. Patient will connect with a provider for ongoing mental health treatment, increase coping skills, healthy habits, self-management skills, and stress reduction      Patient will implement clinical interventions discussed today to decrease symptoms of depression and increase knowledge and/or ability of: coping skills.    Clinical Interventions:  Assessed patient's previous and current treatment, coping skills, support system and barriers to care. Patient provided hx  Verbalization of feelings encouraged, motivational interviewing employed Emotional support provided, positive coping strategies explored. Establishing healthy boundaries emphasized and healthy self-care education provided Patient was educated on available mental health resources within their area that accept Medicaid and offer counseling and psychiatry. Patient was advised to contact the back of her insurance card  for assistance with benefits as well. Patient is agreeable to referral to [facility] for counseling and psychiatry. LCSW placed referral on 09/05/24. Email sent to patient today with available mental health resources within her area that accept Medicaid and offer the services that she is interested in. Email included instructions for scheduling at [facility] as well as some crisis support resources and [facility]'s walk in clinic hours. Patient will review resources over the next two weeks and make a decision regarding where he wishes to gain MH treatment at. Emotional support provided. CBT intervention implemented regarding being mentally fit by combating negative thinking and replacing it with uplifting support, hope and positivity. Patient reports significant worsening anxiety impacting their ability to function appropriately and carry out daily task. Assessed social determinant of health barriers LCSW provided education on relaxation techniques such as meditation, deep breathing, massage, grounding exercises or yoga that can activate the body's relaxation response and ease symptoms of stress and anxiety. LCSW ask that when pt is struggling with difficult emotions and racing thoughts that they start this relaxation response process. LCSW provided extensive education on healthy coping skills for anxiety. SW used active and reflective listening, validated patient's feelings/concerns, and provided emotional support. Patient will work on implementing appropriate self-care habits into their daily routine such as: staying positive, writing a gratitude list, drinking water, staying active around the house, taking their medications as prescribed, combating negative thoughts or emotions and staying connected with their family and friends. Positive reinforcement provided for this decision to work on this.  Motivational Interviewing employed Depression screen reviewed  PHQ2/ PHQ9 completed or reviewed  Mindfulness or  Relaxation training provided Active listening / Reflection utilized  Advance Care  and HCPOA education provided Emotional Support Provided Problem Solving /Task Center strategies reviewed Provided psychoeducation for mental health needs  Provided brief CBT  Reviewed mental health medications and discussed importance of compliance:  Quality of sleep assessed & Sleep Hygiene techniques promoted  Participation in counseling encouraged  Verbalization of feelings encouraged  Suicidal Ideation/Homicidal Ideation assessed: Patient denies SI/HI  Review resources, discussed options and provided patient information about  Mental Health Resources Inter-disciplinary care team collaboration (see longitudinal plan of care) Referral made for [facility] RNCM who is actively involved for diabetes management Patient was a no show for his psychiatry appointment on 10/11/24. Patient states that he had issues logging onto this tele-health appointment and prefers in person appointments now. Patient was advised to contact [facility] to reschedule appointment and to ask for in person and to be on their cancellation list. 10/31/24- Patient has not contacted [facility] to rescheduled no show appointment but is agreeable to do so today. Phone number was provided to patient and email was sent with information. Patient confirmed upcoming counseling appointment. Patient was a no show again for his last scheduled therapy appointment at [facility] in December. Patient was notified that he may not be able to reschedule there due to their no show policy rule and he should consider their walk in clinic. Patient agreeable to contact [facility] today for scheduling. Stress management coping skill education provided.  Patient Goals/Self-Care Activities: Take medications as prescribed   Attend all scheduled provider appointments Call pharmacy for medication refills 3-7 days in advance of running out of medications Perform all self care activities  independently  Perform IADL's (shopping, preparing meals, housekeeping, managing finances) independently Call provider office for new concerns or questions Work with the social worker to address care coordination needs and will continue to work with the clinical team to address health care and disease management related needs call 1-800-273-TALK (toll free, 24 hour hotline) If in a crisis, CALL 988 or go to [facility] [address] [place] [phone]) Utilize healthy coping skills and supportive resources discussed Contact PCP with any questions or concerns Keep 90 percent of counseling appointments Call your insurance provider for more information about your Enhanced Benefits         Please call the Suicide and Crisis Lifeline: 988 call the [place]  National Suicide Prevention Lifeline: (519) 473-7155 or TTY: 915-369-4585 TTY 972-796-5735) to talk to a trained counselor call 1-800-273-TALK (toll free, 24 hour hotline) go to [facility] [address], [place] [phone]) call the [facility] Crisis Line: 813-523-2969 call 911 if you are experiencing a Mental Health or Behavioral Health Crisis or need someone to talk to.  Patient verbalized understanding of Care plan and visit instructions communicated this visit  Lyle Rung, BSW, MSW, LCSW Licensed Clinical Social Worker [employer] [facility]   [email or web address] Direct Dial: [phone]  [facility]

## 2024-12-24 ENCOUNTER — Ambulatory Visit: Admitting: Family Medicine

## 2024-12-25 ENCOUNTER — Ambulatory Visit: Admitting: Family Medicine

## 2025-01-01 ENCOUNTER — Other Ambulatory Visit: Payer: Self-pay | Admitting: Licensed Clinical Social Worker

## 2025-01-02 NOTE — Patient Outreach (Signed)
 Complex Care Management   Visit Note  01/01/2025  Name:  Fred Reyes MRN: [id number] DOB: 1991/02/06  Situation: Referral received for Complex Care Management related to {Criteria:32550} I obtained verbal consent from {CHL AMB Patient/Caregiver:28184}.  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   Thickened nails 08/23/2023   Type 2 diabetes mellitus without complication, without long-term current use of insulin  (HCC) 04/16/2019    Assessment: Patient Reported Symptoms:  Cognitive        Neurological      HEENT        Cardiovascular      Respiratory      Endocrine      Gastrointestinal        Genitourinary      Integumentary      Musculoskeletal          Psychosocial            01/02/2025    PHQ2-9 Depression Screening   Little interest or pleasure in doing things Not at all  Feeling down, depressed, or hopeless Several days  PHQ-2 - Total Score 1  Trouble falling or staying asleep, or sleeping too much    Feeling tired or having little energy    Poor appetite or overeating     Feeling bad about yourself - or that you are a failure or have let yourself or your family down    Trouble concentrating on things, such as reading the newspaper or watching television    Moving or speaking so slowly that other people could have noticed.  Or the opposite - being so fidgety or restless that you have been moving around a lot more than usual    Thoughts that you would be better off dead, or hurting yourself in some way    PHQ2-9 Total Score    If you checked off  any problems, how difficult have these problems made it for you to do your work, take care of things at home, or get along with other people    Depression Interventions/Treatment      There were no vitals filed for this visit.    Medications Reviewed Today     Reviewed by Merlynn Lyle CROME, LCSW (Social Worker) on 01/02/25 at 1245  Med List Status: <None>   Medication Order Taking? Sig Documenting Provider Last Dose Status Informant  atorvastatin  (LIPITOR) 40 MG tablet [id number]  Take 1 tablet (40 mg total) by mouth daily. Tharon Lung, MD  Active   Blood Glucose Monitoring Suppl w/Device KIT [id number]  Check blood sugar once daily BEFORE breakfast. Tharon Lung, MD  Active   Continuous Glucose Sensor (FREESTYLE LIBRE 3 PLUS SENSOR) MISC [id number]  Change sensor every 15 days. McDiarmid, Krystal BIRCH, MD  Active   glucose blood test strip [id number]  Use to check blood sugar once daily BEFORE breakfast. Tharon Lung, MD  Active   Insulin  Pen Needle (PEN NEEDLES) 32G X 5 MM MISC [id number]  1 Container by Does not apply route as needed. McDiarmid, Krystal BIRCH, MD  Active   Lancets MISC [id number]  Use  to check blood sugar once daily BEFORE breakfast. Tharon Lung, MD  Active   metFORMIN  (GLUCOPHAGE -XR) 750 MG 24 hr tablet [id number]  Take 2 tablets (1,500 mg total) by mouth daily with breakfast. Tharon Lung, MD  Active   olmesartan -hydrochlorothiazide (BENICAR  HCT) 20-12.5 MG tablet [id number]  Take 1 tablet by mouth daily. Tharon Lung, MD  Active   SEMGLEE , YFGN, 100 UNIT/ML Pen [id number]  Inject 14-50 Units into the skin daily. Increase dose as instructed. Tharon Lung, MD  Active   tirzepatide  (ZEPBOUND ) 2.5 MG/0.5ML Pen [id number]  Inject 2.5 mg into the skin once a week. Tharon Lung, MD  Active   triamcinolone  ointment (KENALOG ) 0.1 % [id number]  Apply 1 Application topically 2 (two) times daily. Apply until skin lightening is achieved. Tharon Lung, MD  Active             Recommendation:    {RECOMMENDATONS:32554}  Follow Up Plan:   {FOLLOWUP:32559}  SIG ***

## 2025-01-04 NOTE — Patient Instructions (Signed)
 Visit Information  Thank you for taking time to visit with me today. Please don't hesitate to contact me if I can be of assistance to you before our next scheduled appointment.  Our next appointment is by telephone on 01/21/25 at 10 Please call the care guide team at [phone] if you need to cancel or reschedule your appointment.   Following is a copy of your care plan:   Goals Addressed             This Visit's Progress    LCSW [facility] Social Work Care Plan   No change     Current barriers:   Mental Health needs related to symptoms of depression, stress and anxiety. Patient requires Support, Education, Resources, Referrals, Advocacy, and Care Coordination, in order to meet Unmet Mental Health Needs and to find a therapist and psychiatrist. Mental Health Concerns and Social Isolation Patient lacks knowledge of local and available community resources and counseling agencies.   Clinical Goal(s): verbalize understanding of plan for management of Anxiety, Depression, and Stress symptoms and demonstrate a reduction in symptoms. Patient will connect with a provider for ongoing mental health treatment, increase coping skills, healthy habits, self-management skills, and stress reduction      Patient will implement clinical interventions discussed today to decrease symptoms of depression and increase knowledge and/or ability of: coping skills.    Clinical Interventions:  Assessed patient's previous and current treatment, coping skills, support system and barriers to care. Patient provided hx  Verbalization of feelings encouraged, motivational interviewing employed Emotional support provided, positive coping strategies explored. Establishing healthy boundaries emphasized and healthy self-care education provided Patient was educated on available mental health resources within their area that accept Medicaid and offer counseling and psychiatry. Patient was advised to contact the back of her insurance  card for assistance with benefits as well. Patient is agreeable to referral to [facility] for counseling and psychiatry. LCSW placed referral on 09/05/24. Email sent to patient today with available mental health resources within her area that accept Medicaid and offer the services that she is interested in. Email included instructions for scheduling at [facility] as well as some crisis support resources and [facility]'s walk in clinic hours. Patient will review resources over the next two weeks and make a decision regarding where he wishes to gain MH treatment at. Emotional support provided. CBT intervention implemented regarding being mentally fit by combating negative thinking and replacing it with uplifting support, hope and positivity. Patient reports significant worsening anxiety impacting their ability to function appropriately and carry out daily task. Assessed social determinant of health barriers LCSW provided education on relaxation techniques such as meditation, deep breathing, massage, grounding exercises or yoga that can activate the body's relaxation response and ease symptoms of stress and anxiety. LCSW ask that when pt is struggling with difficult emotions and racing thoughts that they start this relaxation response process. LCSW provided extensive education on healthy coping skills for anxiety. SW used active and reflective listening, validated patient's feelings/concerns, and provided emotional support. Patient will work on implementing appropriate self-care habits into their daily routine such as: staying positive, writing a gratitude list, drinking water, staying active around the house, taking their medications as prescribed, combating negative thoughts or emotions and staying connected with their family and friends. Positive reinforcement provided for this decision to work on this.  Motivational Interviewing employed Depression screen reviewed  PHQ2/ PHQ9 completed or reviewed  Mindfulness or  Relaxation training provided Active listening / Reflection utilized  Advance Care  and HCPOA education provided Emotional Support Provided Problem Solving /Task Center strategies reviewed Provided psychoeducation for mental health needs  Provided brief CBT  Reviewed mental health medications and discussed importance of compliance:  Quality of sleep assessed & Sleep Hygiene techniques promoted  Participation in counseling encouraged  Verbalization of feelings encouraged  Suicidal Ideation/Homicidal Ideation assessed: Patient denies SI/HI  Review resources, discussed options and provided patient information about  Mental Health Resources Inter-disciplinary care team collaboration (see longitudinal plan of care) Referral made for [facility]BCI RNCM who is actively involved for diabetes management Patient was a no show for his psychiatry appointment on 10/11/24. Patient states that he had issues logging onto this tele-health appointment and prefers in person appointments now. Patient was advised to contact [facility] to reschedule appointment and to ask for in person and to be on their cancellation list. 10/31/24- Patient has not contacted [facility] to rescheduled no show appointment but is agreeable to do so today. Phone number was provided to patient and email was sent with information. Patient confirmed upcoming counseling appointment. Patient was a no show again for his last scheduled therapy appointment at [facility] in December. Patient was notified that he may not be able to reschedule there due to their no show policy rule and he should consider their walk in clinic. Patient agreeable to contact [facility] today for scheduling. Stress management coping skill education provided. Patient was a no show for in office PCP appointments twice in the last 60 days. VBCI LCSW encouraged patient to contact both offices to reschedule missed appointments. Patient agreeable to this plan on 01/02/25 and understands CCM program will be  closed if not completed by next outreach call.  Patient Goals/Self-Care Activities: Take medications as prescribed   Attend all scheduled provider appointments Call pharmacy for medication refills 3-7 days in advance of running out of medications Perform all self care activities independently  Perform IADL's (shopping, preparing meals, housekeeping, managing finances) independently Call provider office for new concerns or questions Work with the social worker to address care coordination needs and will continue to work with the clinical team to address health care and disease management related needs call 1-800-273-TALK (toll free, 24 hour hotline) If in a crisis, CALL 988 or go to [facility] [address], [place] [phone]) Utilize healthy coping skills and supportive resources discussed Contact PCP with any questions or concerns Keep 90 percent of counseling appointments Call your insurance provider for more information about your Enhanced Benefits         Please call the Suicide and Crisis Lifeline: 988 call the [place]  National Suicide Prevention Lifeline: 478-299-4569 or TTY: 402-095-7450 TTY (684)073-0891) to talk to a trained counselor call 1-800-273-TALK (toll free, 24 hour hotline) go to [facility] [address], [place] [phone]) call the [facility] Crisis Line: 803-276-4620 call 911 if you are experiencing a Mental Health or Behavioral Health Crisis or need someone to talk to.  Patient verbalized understanding of Care plan and visit instructions communicated this visit  Lyle Rung, BSW, MSW, LCSW Licensed Clinical Social Worker [employer]   [facility] [email or web address] Direct Dial: [phone]

## 2025-01-20 ENCOUNTER — Encounter: Payer: Self-pay | Admitting: Adult Health

## 2025-01-20 ENCOUNTER — Encounter: Admitting: Adult Health

## 2025-01-21 ENCOUNTER — Encounter: Payer: Self-pay | Admitting: Licensed Clinical Social Worker

## 2025-01-21 ENCOUNTER — Telehealth: Payer: Self-pay | Admitting: Licensed Clinical Social Worker

## 2025-01-22 NOTE — Patient Instructions (Signed)
 [facility] - I am sorry I was unable to reach you today for our scheduled appointment. I work with Tharon Lung, MD and am calling to support your healthcare needs. Please contact me at [phone] at your earliest convenience. I look forward to speaking with you soon.   Thank you,  Lyle Rung, BSW, MSW, LCSW Licensed Clinical Social Worker [employer]   [email or web address] Direct Dial: [phone]

## 2025-02-05 ENCOUNTER — Telehealth: Admitting: Licensed Clinical Social Worker

## 2025-02-06 ENCOUNTER — Ambulatory Visit: Payer: Self-pay | Admitting: Family Medicine

## 2025-02-06 ENCOUNTER — Ambulatory Visit: Admitting: Family Medicine
